# Patient Record
Sex: Female | Born: 1980 | Race: White | Hispanic: No | Marital: Married | Smoking: Never smoker
Health system: Southern US, Community
[De-identification: ages and names within clinical notes are randomized; demographics above are authoritative.]

## PROBLEM LIST (undated history)

## (undated) DIAGNOSIS — R42 Dizziness and giddiness: Secondary | ICD-10-CM

## (undated) DIAGNOSIS — E079 Disorder of thyroid, unspecified: Secondary | ICD-10-CM

## (undated) HISTORY — DX: Disorder of thyroid, unspecified: E07.9

## (undated) HISTORY — DX: Dizziness and giddiness: R42

---

## 2010-09-22 ENCOUNTER — Other Ambulatory Visit: Payer: Self-pay | Admitting: Family Medicine

## 2010-09-22 DIAGNOSIS — IMO0002 Reserved for concepts with insufficient information to code with codable children: Secondary | ICD-10-CM

## 2010-09-24 ENCOUNTER — Ambulatory Visit
Admission: RE | Admit: 2010-09-24 | Discharge: 2010-09-24 | Disposition: A | Discharge status: Home / Self Care | Payer: PRIVATE HEALTH INSURANCE | Source: Ambulatory Visit | Attending: Family Medicine | Admitting: Family Medicine

## 2010-09-29 ENCOUNTER — Other Ambulatory Visit (HOSPITAL_COMMUNITY): Payer: Self-pay | Admitting: Family Medicine

## 2010-09-29 DIAGNOSIS — E079 Disorder of thyroid, unspecified: Secondary | ICD-10-CM

## 2010-09-30 ENCOUNTER — Other Ambulatory Visit: Payer: Self-pay | Admitting: *Deleted

## 2010-09-30 ENCOUNTER — Other Ambulatory Visit (HOSPITAL_COMMUNITY): Payer: PRIVATE HEALTH INSURANCE

## 2010-09-30 ENCOUNTER — Other Ambulatory Visit: Payer: Self-pay | Admitting: Family Medicine

## 2010-09-30 DIAGNOSIS — E041 Nontoxic single thyroid nodule: Secondary | ICD-10-CM

## 2010-10-02 ENCOUNTER — Other Ambulatory Visit (HOSPITAL_COMMUNITY)
Admission: RE | Admit: 2010-10-02 | Discharge: 2010-10-02 | Disposition: A | Discharge status: Home / Self Care | Payer: PRIVATE HEALTH INSURANCE | Source: Ambulatory Visit | Attending: Interventional Radiology | Admitting: Interventional Radiology

## 2010-10-02 ENCOUNTER — Other Ambulatory Visit: Payer: Self-pay | Admitting: Interventional Radiology

## 2010-10-02 ENCOUNTER — Ambulatory Visit
Admission: RE | Admit: 2010-10-02 | Discharge: 2010-10-02 | Disposition: A | Discharge status: Home / Self Care | Payer: PRIVATE HEALTH INSURANCE | Source: Ambulatory Visit | Attending: Family Medicine | Admitting: Family Medicine

## 2010-10-02 DIAGNOSIS — E041 Nontoxic single thyroid nodule: Secondary | ICD-10-CM

## 2010-10-02 DIAGNOSIS — E049 Nontoxic goiter, unspecified: Secondary | ICD-10-CM | POA: Insufficient documentation

## 2010-10-20 ENCOUNTER — Encounter (INDEPENDENT_AMBULATORY_CARE_PROVIDER_SITE_OTHER): Payer: Self-pay | Admitting: General Surgery

## 2010-10-21 ENCOUNTER — Encounter (INDEPENDENT_AMBULATORY_CARE_PROVIDER_SITE_OTHER): Payer: Self-pay

## 2010-12-02 HISTORY — PX: THYROID LOBECTOMY: SHX420

## 2010-12-07 ENCOUNTER — Other Ambulatory Visit (INDEPENDENT_AMBULATORY_CARE_PROVIDER_SITE_OTHER): Payer: Self-pay | Admitting: General Surgery

## 2010-12-07 ENCOUNTER — Encounter (HOSPITAL_COMMUNITY): Payer: PRIVATE HEALTH INSURANCE

## 2010-12-07 LAB — CBC
HCT: 39.6 % (ref 36.0–46.0)
MCH: 27.6 pg (ref 26.0–34.0)
MCHC: 33.3 g/dL (ref 30.0–36.0)
MCV: 82.8 fL (ref 78.0–100.0)
Platelets: 176 10*3/uL (ref 150–400)
RDW: 13.3 % (ref 11.5–15.5)
WBC: 8.8 10*3/uL (ref 4.0–10.5)

## 2010-12-07 LAB — DIFFERENTIAL
Eosinophils Absolute: 0.1 10*3/uL (ref 0.0–0.7)
Eosinophils Relative: 1 % (ref 0–5)
Lymphocytes Relative: 16 % (ref 12–46)
Lymphs Abs: 1.4 10*3/uL (ref 0.7–4.0)
Monocytes Absolute: 0.5 10*3/uL (ref 0.1–1.0)
Monocytes Relative: 6 % (ref 3–12)

## 2010-12-07 LAB — URINALYSIS, ROUTINE W REFLEX MICROSCOPIC
Bilirubin Urine: NEGATIVE
Hgb urine dipstick: NEGATIVE
Ketones, ur: NEGATIVE mg/dL
Nitrite: NEGATIVE
Urobilinogen, UA: 0.2 mg/dL (ref 0.0–1.0)

## 2010-12-07 LAB — HCG, SERUM, QUALITATIVE: Preg, Serum: NEGATIVE

## 2010-12-07 LAB — COMPREHENSIVE METABOLIC PANEL
ALT: 9 U/L (ref 0–35)
AST: 12 U/L (ref 0–37)
Albumin: 3.8 g/dL (ref 3.5–5.2)
CO2: 27 mEq/L (ref 19–32)
Calcium: 9.6 mg/dL (ref 8.4–10.5)
Creatinine, Ser: 0.61 mg/dL (ref 0.50–1.10)
GFR calc non Af Amer: >60 mL/min (ref >60)
Sodium: 140 mEq/L (ref 135–145)
Total Protein: 7.9 g/dL (ref 6.0–8.3)

## 2010-12-07 LAB — SURGICAL PCR SCREEN: Staphylococcus aureus: NEGATIVE

## 2010-12-10 ENCOUNTER — Ambulatory Visit (HOSPITAL_COMMUNITY)
Admission: RE | Admit: 2010-12-10 | Discharge: 2010-12-10 | Disposition: A | Discharge status: Home / Self Care | Payer: PRIVATE HEALTH INSURANCE | Source: Ambulatory Visit | Attending: General Surgery | Admitting: General Surgery

## 2010-12-10 ENCOUNTER — Other Ambulatory Visit (INDEPENDENT_AMBULATORY_CARE_PROVIDER_SITE_OTHER): Payer: Self-pay | Admitting: General Surgery

## 2010-12-10 DIAGNOSIS — Z01810 Encounter for preprocedural cardiovascular examination: Secondary | ICD-10-CM | POA: Insufficient documentation

## 2010-12-10 DIAGNOSIS — Z01818 Encounter for other preprocedural examination: Secondary | ICD-10-CM

## 2010-12-10 DIAGNOSIS — E079 Disorder of thyroid, unspecified: Secondary | ICD-10-CM | POA: Insufficient documentation

## 2010-12-10 DIAGNOSIS — Z01812 Encounter for preprocedural laboratory examination: Secondary | ICD-10-CM | POA: Insufficient documentation

## 2010-12-18 ENCOUNTER — Ambulatory Visit (HOSPITAL_COMMUNITY)
Admission: RE | Admit: 2010-12-18 | Discharge: 2010-12-19 | Disposition: A | Discharge status: Home / Self Care | Payer: PRIVATE HEALTH INSURANCE | Source: Ambulatory Visit | Attending: General Surgery | Admitting: General Surgery

## 2010-12-18 ENCOUNTER — Other Ambulatory Visit (INDEPENDENT_AMBULATORY_CARE_PROVIDER_SITE_OTHER): Payer: Self-pay | Admitting: General Surgery

## 2010-12-18 DIAGNOSIS — E041 Nontoxic single thyroid nodule: Secondary | ICD-10-CM | POA: Insufficient documentation

## 2010-12-18 DIAGNOSIS — Z01818 Encounter for other preprocedural examination: Secondary | ICD-10-CM | POA: Insufficient documentation

## 2010-12-18 DIAGNOSIS — Z01812 Encounter for preprocedural laboratory examination: Secondary | ICD-10-CM | POA: Insufficient documentation

## 2010-12-18 DIAGNOSIS — Z01810 Encounter for preprocedural cardiovascular examination: Secondary | ICD-10-CM | POA: Insufficient documentation

## 2010-12-18 HISTORY — PX: THYROID SURGERY: SHX805

## 2010-12-18 LAB — NO BLOOD PRODUCTS

## 2010-12-21 NOTE — Op Note (Signed)
NAME:  Lindsey Foley, Lindsey Foley                   ACCOUNT NO.:  [id number]  MEDICAL RECORD NO.:  [id number]  LOCATION:  1539                         FACILITY:  [facility]  PHYSICIAN:  Angelia Mould. Derrell Lolling, M.D.DATE OF BIRTH:  1980/11/07  DATE OF PROCEDURE:  12/18/2010 DATE OF DISCHARGE:                              OPERATIVE REPORT   PREOPERATIVE DIAGNOSIS:  Left thyroid nodule with atypical cytology.  POSTOPERATIVE DIAGNOSIS:  Left thyroid nodule with atypical cytology.  OPERATION PERFORMED:  Left thyroid lobectomy with frozen section.  SURGEON:  Angelia Mould. Derrell Lolling, MD  FIRST ASSISTANT:  Lorne Skeens. Hoxworth, MD  OPERATIVE INDICATIONS:  This is a 30 year old Caucasian female who was discovered to have a left thyroid mass when she felt a small lump in her left neck back in May of this year.  She could feel this, but there was no voice change or swallowing problems.  Thyroid function tests were normal.  A thyroid ultrasound showed a 2.3 x 1.7 x 2.2 cm complex cystic and solid lesion in the inferior aspect of the left lobe.  There were also 3 tiny subcentimeter nodules in the right thyroid lobe.  There was no adenopathy.  Fine-needle aspiration cytology was performed at the left inferior pole nodule.  Cytopathology showed follicular cells with some mild cytologic atypia.  She was counseled as an outpatient.  Her risk tolerance is very low and she wanted to have this area excised because of the mild atypia.  I felt that was an appropriate option and she is brought to operating room electively.  OPERATIVE FINDINGS:  There was a small, firm nodule in the left inferior pole.  It was about 1 cm in size, smaller than the imaging dimensions on ultrasound.  There was no adenopathy.  I could palpate no abnormality of the right thyroid lobe.  Frozen section diagnosis revealed some atypia versus biopsy artifact.  Dr. Frederica Kuster and Dr. Luisa Hart looked at this and were unable to make a diagnosis of cancer and we felt  that there was no indication for completion thyroidectomy.  Further studies will be done.  OPERATIVE TECHNIQUE:  Following induction of general endotracheal anesthesia, the patient was position with her arms at her sides.  They were rolled behind her shoulders and her neck extended.  The neck was prepped and draped in sterile fashion.  Intravenous antibiotics were given.  The patient was identified as correct patient and correct procedure.  A short transverse collar incision was made in the anterior neck about 2 cm above the suprasternal notch.  Dissection was carried down through the platysma muscles.  Skin and platysma flaps were raised superiorly and inferiorly.  A  retractor was placed.  The strap muscles were divided in the midline.  I dissected the strap muscles off the left thyroid lobe.  Some inferior pole vessels were isolated, controlled with tiny metal clips, and divided with Harmonic scalpel. The superior pole took some time to dissect as it went very high.  We identified and preserved the left superior parathyroid gland.  Weultimately took down the superior pole vessels and placed a metal clip on the most superior aspect and then used the Harmonic scalpel  to divide the more inferior part.  We dissected a few other small vascular attachments and then dissected the superior pole down.  We then dissected the rest of the left lobe from lateral to medial.  We kept the dissection in the capsule of the thyroid to avoid injury to the inferior parathyroid gland and the recurrent laryngeal nerve.  We felt that we saw the left recurrent laryngeal nerve and preserved that.  The left thyroid lobe easily came up off the trachea and we took the dissection across the trachea.  We divided the right side at the isthmus with the Harmonic scalpel.  We marked the specimen with silk suture.  The specimen was sent to the lab.  Dr. Frederica Kuster spent about 40 minutes studying the entire nodule.  He felt  that there was some atypia, but could not decide whether this was a low-grade papillary carcinoma or artifact from the biopsy.  We chose not to do any further surgery.  The wound was irrigated with saline.  Hemostasis was excellent.  Some fibrillar hemostatic sponge was placed.  The wound was observed for about 5 minutes, there was no bleeding.  The strap muscles were closed in the midline with interrupted sutures of 3-0 Vicryl.  The platysma muscles were closed with interrupted sutures of 3-0 Vicryl and skin closed with running subcuticular suture of 4-0 Monocryl and Dermabond.  The patient tolerated the procedure well and was taken to recovery room in stable condition.  Estimated blood loss was about 15 cc.  Complications none. Sponge, needle, and instrument counts were correct.     Angelia Mould. Derrell Lolling, M.D.     HMI/MEDQ  D:  12/18/2010  T:  12/19/2010  Job:  [id number]  cc:   Marjory Lies, M.D. Fax: [phone]  Electronically Signed by Claud Kelp M.D. on 12/21/2010 07:38:33 AM

## 2010-12-22 ENCOUNTER — Telehealth (INDEPENDENT_AMBULATORY_CARE_PROVIDER_SITE_OTHER): Payer: Self-pay

## 2010-12-22 NOTE — Telephone Encounter (Signed)
Pt called and wondered if she needed to take a calcium supplement or get follow labs.   I spoke to [facility] Dr Jacinto Halim nurse.  Due to the type of surgery she had she does not need to take any meds.  I told her the symptoms of low calcium and told her to call us if she has those.  She has a f/u on 9-5

## 2010-12-22 NOTE — Telephone Encounter (Signed)
LMOM to call for path result.

## 2010-12-22 NOTE — Telephone Encounter (Signed)
Pt returned call for path. Pt advised path = no sign of cancer. Pt to call if has any numbness or muscle cramps or other concerns.

## 2011-01-05 ENCOUNTER — Institutional Professional Consult (permissible substitution): Payer: PRIVATE HEALTH INSURANCE | Admitting: Cardiology

## 2011-01-06 ENCOUNTER — Ambulatory Visit (INDEPENDENT_AMBULATORY_CARE_PROVIDER_SITE_OTHER): Payer: PRIVATE HEALTH INSURANCE | Admitting: General Surgery

## 2011-01-06 ENCOUNTER — Encounter (INDEPENDENT_AMBULATORY_CARE_PROVIDER_SITE_OTHER): Payer: Self-pay | Admitting: General Surgery

## 2011-01-06 VITALS — BP 116/68 | HR 78 | Temp 96.6°F

## 2011-01-06 DIAGNOSIS — Z9889 Other specified postprocedural states: Secondary | ICD-10-CM

## 2011-01-06 NOTE — Progress Notes (Signed)
Subjective:     Patient ID: Lindsey Foley, female   DOB: Jun 01, 1980, 30 y.o.   MRN: [id number]  HPI This patient underwent left thyroid lobectomy with frozen section on December 18, 2010. Intraoperative frozen section raised a concern for malignancy but they could not confirm this on frozen section. Second opinion was obtained and the final pathology report shows no evidence of malignancy. Thyroid tissue shows cystic degenerative changes. 2 parathyroid glands were found in the specimen. This was surprising since we were almost certain we had preserved the left superior parathyroid gland had kept the dissection in the capsule of the thyroid.  She feels finee. She is swallowing well. She feels that her voice is normal. She denies any paresthesias. I have reviewed her pathology report with her, including the fact that parathyroid glands were removed on the left side. I told her that if she ever had to have the right thyroid lobe removed, that the surgeon would need to be very careful not to remove any other parathyroid glands.  Review of Systems     Objective:   Physical Exam The patient looks very well. She is in good spirits.  Voice is strong. She phonates well.  Chvostek's is sign is positive bilaterally.  Neck incision is healing very nicely. No swelling or complications.    Assessment:     Left thyroid nodule, final pathology report revealed benign cystic degenerative changes.  Status post left thyroid lobectomy. Parathyroid tissue was present within the surgical specimen.  She is asymptomatic and recovering uneventfully.    Plan:     Okay to resume all normal physical activities without restriction.  Obtain lab work to include calcium, TSH, intact parathyroid hormone level today or tomorrow.  Return to see me in 6 weeks for a followup check.

## 2011-01-06 NOTE — Patient Instructions (Signed)
You are doing very well following your left thyroid lobectomy. Your voice is normal. The final pathology report shows no evidence of malignancy, only cystic degenerative changes and biopsy changes. We did find parathyroid glands in the specimen. You may resume all normal physical activities without restriction. You will be sent for lab work today. I will see you back in the office in 6 weeks.

## 2011-01-12 ENCOUNTER — Telehealth (INDEPENDENT_AMBULATORY_CARE_PROVIDER_SITE_OTHER): Payer: Self-pay

## 2011-01-12 NOTE — Telephone Encounter (Signed)
I tried to reach pt re: labs [tsh,calcium,intact pth] she was to have on 01-06-11. [facility] has no info in system showing pt had any labs drawn with them. I lmom for pt to call with if and where labs done.

## 2011-02-02 ENCOUNTER — Other Ambulatory Visit (INDEPENDENT_AMBULATORY_CARE_PROVIDER_SITE_OTHER): Payer: Self-pay | Admitting: General Surgery

## 2011-02-02 DIAGNOSIS — Z9889 Other specified postprocedural states: Secondary | ICD-10-CM

## 2011-02-03 ENCOUNTER — Other Ambulatory Visit (INDEPENDENT_AMBULATORY_CARE_PROVIDER_SITE_OTHER): Payer: Self-pay | Admitting: General Surgery

## 2011-02-04 LAB — TSH: TSH: 3.406 u[IU]/mL (ref 0.350–4.500)

## 2011-02-18 ENCOUNTER — Telehealth (INDEPENDENT_AMBULATORY_CARE_PROVIDER_SITE_OTHER): Payer: Self-pay

## 2011-02-18 NOTE — Telephone Encounter (Signed)
I lmom for pt [id number]. Pt has appt 10-22. Dr Derrell Lolling off.  Dr Derrell Lolling will discuss labs at this office visit.

## 2011-02-22 ENCOUNTER — Ambulatory Visit (INDEPENDENT_AMBULATORY_CARE_PROVIDER_SITE_OTHER): Payer: PRIVATE HEALTH INSURANCE | Admitting: General Surgery

## 2011-02-22 ENCOUNTER — Encounter (INDEPENDENT_AMBULATORY_CARE_PROVIDER_SITE_OTHER): Payer: Self-pay | Admitting: General Surgery

## 2011-02-22 VITALS — BP 112/78 | HR 76 | Temp 97.9°F | Resp 12 | Ht 65.0 in | Wt 137.0 lb

## 2011-02-22 DIAGNOSIS — Z9889 Other specified postprocedural states: Secondary | ICD-10-CM

## 2011-02-22 NOTE — Progress Notes (Signed)
Subjective:     Patient ID: Lindsey Foley, female   DOB: 1980/12/01, 30 y.o.   MRN: [id number]  HPI  This patient underwent left thyroid lobectomy several weeks ago. She has done well. She has no complaints. Voice is normal. Swallowing is normal. No pain or pressure symptoms. Scar healing uneventfully.  Pathology showed benign degenerative changes in the left thyroid lobe and 2 parathyroid glands were found embedded within the thyroid.  Labs recently shows TSH 3.406, calcium level 9.7, parathyroid hormone 17.2. These are all normal. Review of Systems     Objective:   Physical Exam Patient is in no distress. She has her 66-year-old with her.  Voice normal  Chvostek's sign negative bilaterally.  Neck supple nontender. Scar well healed. No mass    Assessment:     Benign, nontoxic multinodular goiter, uneventful recovery following left thyroid lobectomy.  Benign appearing, subcentimeter nodules remain in the right thyroid lobe. These are low risk.    Plan:     I advised her to get a thyroid ultrasound in 2 years, and review that with Dr. Marjory Lies.  Return to see me p.r.n.

## 2011-02-22 NOTE — Patient Instructions (Signed)
Your  physical exam is normal today. All of your lab work is normal. I do not think that anything further needs to be done. You have tiny nodules in the remaining right thyroid lobe. I recommended that you get an ultrasound in 2 years and have Dr. Rosezetta Schlatter review that with you. Return to see me if any new problems arise.

## 2011-10-01 ENCOUNTER — Ambulatory Visit (INDEPENDENT_AMBULATORY_CARE_PROVIDER_SITE_OTHER): Payer: BC Managed Care – PPO | Admitting: Internal Medicine

## 2011-10-01 ENCOUNTER — Ambulatory Visit (INDEPENDENT_AMBULATORY_CARE_PROVIDER_SITE_OTHER)
Admission: RE | Admit: 2011-10-01 | Discharge: 2011-10-01 | Disposition: A | Discharge status: Home / Self Care | Payer: BC Managed Care – PPO | Source: Ambulatory Visit | Attending: Internal Medicine | Admitting: Internal Medicine

## 2011-10-01 ENCOUNTER — Encounter: Payer: Self-pay | Admitting: Internal Medicine

## 2011-10-01 VITALS — BP 110/80 | HR 100 | Ht 65.0 in | Wt 136.0 lb

## 2011-10-01 DIAGNOSIS — R0602 Shortness of breath: Secondary | ICD-10-CM

## 2011-10-01 DIAGNOSIS — R0609 Other forms of dyspnea: Secondary | ICD-10-CM

## 2011-10-01 DIAGNOSIS — H7290 Unspecified perforation of tympanic membrane, unspecified ear: Secondary | ICD-10-CM

## 2011-10-01 DIAGNOSIS — R0600 Dyspnea, unspecified: Secondary | ICD-10-CM

## 2011-10-01 DIAGNOSIS — R0989 Other specified symptoms and signs involving the circulatory and respiratory systems: Secondary | ICD-10-CM

## 2011-10-01 DIAGNOSIS — J310 Chronic rhinitis: Secondary | ICD-10-CM

## 2011-10-01 DIAGNOSIS — H7291 Unspecified perforation of tympanic membrane, right ear: Secondary | ICD-10-CM

## 2011-10-01 DIAGNOSIS — IMO0001 Reserved for inherently not codable concepts without codable children: Secondary | ICD-10-CM

## 2011-10-01 NOTE — Patient Instructions (Signed)
Order- CXR- dx dyspnea              CT face without contrast    Dx dyspnea, hx traumatic perforation right tympanum

## 2011-10-01 NOTE — Progress Notes (Signed)
10/01/11- 30 yoF smoker referred courtesy of Dr. Marjory Lies /[facility] because of shortness of breath. She says this symptom has been newly the past year. She relates onset to an episode where she was bumped by her small daughter and perforated her eardrum with a Q-tip. One week later she had dizziness, vertigo, shortness of breath. Vertigo is described as mild intermittent unsteadiness. A neurologist tried the Epley maneuver unsuccessfully. An ENT did calorics which were inconclusive. She was found to have a thyroid nodule which was removed in August of 2012. The mild non-exertional dyspnea persisted. It was waking her to sit up at night. Deep breaths are unsatisfying. Sometimes she feels something in her upper trachea but other times is not. Blowing some mucus from her nose and complains of nasal congestion but the ENT did not examine her nose. She has no prior lung history. She says she was fine while backpacking at altitude in April during some hard hiking without shortness of breath. She wears a mask when working outdoors. Minor dry cough but no wheeze. Occasional headache but little sneezing or blowing usually. Denies history of previous allergy concern. No unusual exposures. 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  Prior to Admission medications   Medication Sig Start Date End Date Taking? Authorizing Provider  CALCIUM PO Take by mouth. Once a week   Yes Historical Provider, MD  Cholecalciferol (VITAMIN D PO) Take by mouth. Once a week   Yes Historical Provider, MD   Past Medical History  Diagnosis Date  . Vertigo   . Thyroid disease    Past Surgical History  Procedure Date  . Thyroid surgery 12/18/2010  . Thyroid lobectomy 12/02/10  .famh History   Social History  . Marital Status: Married    Spouse Name: N/A    Number of Children: N/A  . Years of Education: N/A   Occupational History  . Not on file.   Social History Main Topics  . Smoking status:  Never Smoker   . Smokeless tobacco: Not on file  . Alcohol Use: Yes     once or twice per week  . Drug Use: No  . Sexually Active: Not on file   Other Topics Concern  . Not on file   Social History Narrative  . No narrative on file  .ROS-see HPI Constitutional:   No-   weight loss, night sweats, fevers, chills, fatigue, lassitude. HEENT:   No-  headaches, difficulty swallowing, tooth/dental problems, sore throat,       No-  sneezing, itching, ear ache, +nasal congestion, post nasal drip,  CV:  No-   chest pain, orthopnea, PND, swelling in lower extremities, anasarca, dizziness, palpitations Resp: +  shortness of breath with exertion or at rest.              No-   productive cough,  + non-productive cough,  No- coughing up of blood.              No-   change in color of mucus.  No- wheezing.   Skin: No-   rash or lesions. GI:  No-   heartburn, indigestion, abdominal pain, nausea, vomiting, diarrhea,                 change in bowel habits, loss of appetite GU: No-   dysuria, change in color of urine, no urgency or frequency.  No- flank pain. MS:  No-   joint pain or swelling.  No-  decreased range of motion.  No- back pain. Neuro-     nothing unusual Psych:  No- change in mood or affect. No depression or anxiety.  No memory loss.  OBJ- Physical Exam General- Alert, Oriented, Affect-appropriate, Distress- none acute, WDWN Skin- rash-none, lesions- none, excoriation- none Lymphadenopathy- none Head- atraumatic            Eyes- Gross vision intact, PERRLA, conjunctivae and secretions clear            Ears- Hearing, canals-normal            Nose- mild turbinate edema, no-Septal dev, mucus, polyps, erosion, perforation             Throat- Mallampati II , mucosa clear , drainage- none, tonsils- atrophic Neck- flexible , trachea midline, no stridor , thyroid nl, carotid no bruit Chest - symmetrical excursion , unlabored           Heart/CV- RRR , no murmur , no gallop  , no rub, nl s1  s2                           - JVD- none , edema- none, stasis changes- none, varices- none           Lung- clear to P&A, wheeze- none, cough- none , dullness-none, rub- none           Chest wall-  Abd- tender-no, distended-no, bowel sounds-present, HSM- no Br/ Gen/ Rectal- Not done, not indicated Extrem- cyanosis- none, clubbing, none, atrophy- none, strength- nl Neuro- grossly intact to observation

## 2011-10-05 NOTE — Progress Notes (Signed)
Quick Note:  LMTCB ______ 

## 2011-10-06 ENCOUNTER — Encounter: Payer: Self-pay | Admitting: Internal Medicine

## 2011-10-06 DIAGNOSIS — IMO0001 Reserved for inherently not codable concepts without codable children: Secondary | ICD-10-CM | POA: Insufficient documentation

## 2011-10-06 DIAGNOSIS — J310 Chronic rhinitis: Secondary | ICD-10-CM | POA: Insufficient documentation

## 2011-10-06 NOTE — Assessment & Plan Note (Signed)
She describes occasionally bothersome nasal congestion. This may be allergic. It may be contributing to her otherwise unexplained complaint of dyspnea unrelated to exertion. I don't know if there is a way to tie in her history of previous TM injury with Q-tip. If the connection is real, and some mechanism involving her eustachian tube seems likely. Plan-based on discussion with radiologist we are ordering CT scan of Face, which should go back far enough to include the middle ear.

## 2011-10-06 NOTE — Assessment & Plan Note (Signed)
Nonspecific dyspnea noted primarily when she is at rest and attentive, but not with significant exertion, hiking at altitude.  sometimes increased nasal airway obstruction is felt as more generalized shortness of breath. Plan-chest x-ray

## 2011-10-07 NOTE — Progress Notes (Signed)
Quick Note:  Pt aware of results. ______ 

## 2011-10-14 ENCOUNTER — Ambulatory Visit (INDEPENDENT_AMBULATORY_CARE_PROVIDER_SITE_OTHER)
Admission: RE | Admit: 2011-10-14 | Discharge: 2011-10-14 | Disposition: A | Discharge status: Home / Self Care | Payer: BC Managed Care – PPO | Source: Ambulatory Visit | Attending: Internal Medicine | Admitting: Internal Medicine

## 2011-10-14 ENCOUNTER — Telehealth: Payer: Self-pay | Admitting: Internal Medicine

## 2011-10-14 DIAGNOSIS — H7290 Unspecified perforation of tympanic membrane, unspecified ear: Secondary | ICD-10-CM

## 2011-10-14 DIAGNOSIS — H7291 Unspecified perforation of tympanic membrane, right ear: Secondary | ICD-10-CM

## 2011-10-14 DIAGNOSIS — R0600 Dyspnea, unspecified: Secondary | ICD-10-CM

## 2011-10-14 DIAGNOSIS — R0989 Other specified symptoms and signs involving the circulatory and respiratory systems: Secondary | ICD-10-CM

## 2011-10-14 NOTE — Telephone Encounter (Signed)
CY pt is calling and requesting the results of the ct scan that was done this morning. Please advise.  thanks

## 2011-10-14 NOTE — Telephone Encounter (Signed)
Pt aware that we have not gotten the results back as of this time. As soon as results are released to [facility] then I will call her with results. Pt was very understanding and will forward to CY as a reminder.

## 2011-10-15 ENCOUNTER — Telehealth: Payer: Self-pay | Admitting: Internal Medicine

## 2011-10-15 NOTE — Telephone Encounter (Signed)
Pt is requesting her sinus CT results. Please advise Dr. Maple Hudson thanks

## 2011-10-18 NOTE — Telephone Encounter (Signed)
Pt returned call.  Advised of CT results / recs as stated by CDY.  Pt verbalized her understanding but does ask if she needs to keep her 7.5.13 follow up with CDY.  Advised pt that we would like to see how she is doing but pt requests to know from CDY.  Dr Maple Hudson please advise, thanks.  *ok to LM if unable to reach pt.

## 2011-10-18 NOTE — Telephone Encounter (Signed)
Spoke with pt and notified of recs per CDY. Pt verbalized understanding and states prefers to just keep her planned appt. Nothing further needed.

## 2011-10-18 NOTE — Telephone Encounter (Signed)
Pt called back Lindsey Foley  

## 2011-10-18 NOTE — Telephone Encounter (Signed)
Notes Recorded by Waymon Budge, MD on 10/15/2011 at 4:45 PM CT shows clear sinuses and ears/ canals look normal by this imaging technique. Nothing seen to suggest further studies for now.  lmomtcb x1

## 2011-10-18 NOTE — Telephone Encounter (Signed)
Error.  Duplicate message.  Holly D Pryor °- °

## 2011-10-18 NOTE — Telephone Encounter (Signed)
I have "result noted" this CT of the face report and forwarded to you to  Contact patient. Thanks.

## 2011-10-18 NOTE — Telephone Encounter (Signed)
Boone Master, MA 10/18/2011 12:30 PM Signed  Pt returned call. Advised of CT results / recs as stated by CDY. Pt verbalized her understanding but does ask if she needs to keep her 7.5.13 follow up with CDY. Advised pt that we would like to see how she is doing but pt requests to know from CDY. Dr Maple Hudson please advise, thanks.  *ok to LM if unable to reach pt.  Please see phone note on 10-15-11 for more information.

## 2011-10-18 NOTE — Telephone Encounter (Signed)
Her initial concern was shortness of breath. CXR was normal. She worried that there might be a connection to an injury to her ear, but CT scan and ENT exam were normal. If she is still having concerning shortness of breath, then I should see her again and get PFTs.  If shortness of breath is no longer bothering her, then I will be happy to see her again if needed.

## 2011-10-18 NOTE — Telephone Encounter (Signed)
Pt is requesting results.  Pt is upset that she was not called on Friday.  Pt asked to be reached at [phone].  Lindsey Foley

## 2011-10-21 NOTE — Progress Notes (Signed)
Quick Note:  Lindsey Foley, [facility] 10/18/2011 2:41 PM Signed Spoke with pt and notified of recs per CDY. Pt verbalized understanding and states prefers to just keep her planned appt. Nothing further needed.  Waymon Budge, MD 10/18/2011 1:05 PM Signed Her initial concern was shortness of breath. CXR was normal. She worried that there might be a connection to an injury to her ear, but CT scan and ENT exam were normal. If she is still having concerning shortness of breath, then I should see her again and get PFTs. If shortness of breath is no longer bothering her, then I will be happy to see her again if needed ______

## 2011-11-05 ENCOUNTER — Encounter: Payer: Self-pay | Admitting: Internal Medicine

## 2011-11-05 ENCOUNTER — Ambulatory Visit (INDEPENDENT_AMBULATORY_CARE_PROVIDER_SITE_OTHER): Payer: BC Managed Care – PPO | Admitting: Internal Medicine

## 2011-11-05 VITALS — BP 112/82 | HR 85 | Ht 65.0 in | Wt 137.0 lb

## 2011-11-05 DIAGNOSIS — R0602 Shortness of breath: Secondary | ICD-10-CM

## 2011-11-05 DIAGNOSIS — IMO0001 Reserved for inherently not codable concepts without codable children: Secondary | ICD-10-CM

## 2011-11-05 NOTE — Patient Instructions (Addendum)
Order  PFT and 6 MWT    Dx dyspnea  You may want to have Dr Doristine Counter talk with a couple of ENT/ Neurology doctors before referral, to be sure of the most useful path.

## 2011-11-05 NOTE — Progress Notes (Signed)
10/01/11- 30 yoF never smoker referred courtesy of Dr. Marjory Lies /[facility] because of shortness of breath. She says this symptom has been newly the past year. She relates onset to an episode where she was bumped by her small daughter and perforated her eardrum with a Q-tip. One week later she had dizziness, vertigo, shortness of breath. Vertigo is described as mild intermittent unsteadiness. A neurologist tried the Epley maneuver unsuccessfully. An ENT did calorics which were inconclusive. She was found to have a thyroid nodule which was removed in August of 2012. The mild non-exertional dyspnea persisted. It was waking her to sit up at night. Deep breaths are unsatisfying. Sometimes she feels something in her upper trachea but other times is not. Blowing some mucus from her nose and complains of nasal congestion but the ENT did not examine her nose. She has no prior lung history. She says she was fine while backpacking at altitude in April during some hard hiking without shortness of breath. She wears a mask when working outdoors. Minor dry cough but no wheeze. Occasional headache but little sneezing or blowing usually. Denies history of previous allergy concern. No unusual exposures. 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  11/05/11- 30 yoF never smoker followed for dyspnea, rhinitis. No improvement since last visit-has not noticed any change. She denies any change in her sense of exertional dyspnea, unrelated to the rainy weather outside, position or exposures. She has a feeling of anterior chest wall tightness which is constant. She still thinks something about her years and onset of vertigo or related to the dyspnea, starting after she ruptured eardrum. She has been photophobic, often getting nauseated at the grocery store, and unsteady. We discussed possibility of vestibular migraine. Her cardiologist had told her this was not a cardiac issue. We reviewed her imaging  studies. CXR 10/07/11-  IMPRESSION:  Negative chest.  Original Report Authenticated By: Jamesetta Orleans. MATTERN, M.D.  CT max facial-  Findings: The eardrum is difficult to adequately assessed by CT  imaging but appears to be grossly intact without findings of  cholesteatoma. Ossicles intact. Middle ear cavity and mastoid air  cells clear.  The roof of the superior semicircular canal is thin bilaterally  without clear dehiscence. Visualized paranasal sinuses are clear.  Visualized intracranial structures unremarkable without obvious  internal auditory canal lesion. If this is of concern, MR imaging  utilizing internal auditory canal protocol contrast would be more  sensitive for detection of such.  Nasal septal spur to the right.  IMPRESSION:  No findings of cholesteatoma. Please see above.  Original Report Authenticated By: Fuller Canada, M.D.   ROS-see HPI Constitutional:   No-   weight loss, night sweats, fevers, chills, fatigue, lassitude. HEENT:   No-  headaches, difficulty swallowing, tooth/dental problems, sore throat,       No-  sneezing, itching, ear ache, +nasal congestion, post nasal drip,  CV:  No-   chest pain, orthopnea, PND, swelling in lower extremities, anasarca, dizziness, palpitations Resp: +  shortness of breath with exertion or at rest.              No-   productive cough,  + non-productive cough,  No- coughing up of blood.              No-   change in color of mucus.  No- wheezing.   Skin: No-   rash or lesions. GI:  No-   heartburn, indigestion, abdominal pain, nausea,  vomiting,  GU:  MS:  No-   joint pain or swelling.  . Neuro-     nothing unusual Psych:  No- change in mood or affect. No depression or anxiety.  No memory loss.  OBJ- Physical Exam General- Alert, Oriented, Affect-appropriate, Distress- none acute, WDWN Skin- rash-none, lesions- none, excoriation- none Lymphadenopathy- none Head- atraumatic            Eyes- Gross vision intact, PERRLA,  conjunctivae and secretions clear            Ears- Hearing, canals-normal            Nose- mild turbinate edema, no-Septal dev, mucus, polyps, erosion, perforation             Throat- Mallampati II , mucosa clear , drainage- none, tonsils- atrophic Neck- flexible , trachea midline, no stridor , thyroid nl, carotid no bruit Chest - symmetrical excursion , unlabored           Heart/CV- RRR , no murmur , no gallop  , no rub, nl s1 s2                           - JVD- none , edema- none, stasis changes- none, varices- none           Lung- clear to P&A, wheeze- none, cough- none , dullness-none, rub- none           Chest wall-  Abd- Br/ Gen/ Rectal- Not done, not indicated Extrem- cyanosis- none, clubbing, none, atrophy- none, strength- nl Neuro- grossly intact to observation

## 2011-11-08 NOTE — Assessment & Plan Note (Signed)
We're not able to identify any abnormality of structural anatomy corresponding to her complaints. Plan-6 minute walk test, PFT. If these are unrevealing, I will likely have to feel her symptoms are not in my area of experience.

## 2011-11-19 ENCOUNTER — Ambulatory Visit (INDEPENDENT_AMBULATORY_CARE_PROVIDER_SITE_OTHER): Payer: BC Managed Care – PPO | Admitting: Internal Medicine

## 2011-11-19 DIAGNOSIS — R0600 Dyspnea, unspecified: Secondary | ICD-10-CM

## 2011-11-19 DIAGNOSIS — IMO0001 Reserved for inherently not codable concepts without codable children: Secondary | ICD-10-CM

## 2011-11-19 DIAGNOSIS — R0602 Shortness of breath: Secondary | ICD-10-CM

## 2011-11-19 DIAGNOSIS — R0989 Other specified symptoms and signs involving the circulatory and respiratory systems: Secondary | ICD-10-CM

## 2011-11-19 LAB — PULMONARY FUNCTION TEST

## 2011-11-19 NOTE — Progress Notes (Signed)
PFT Done. Jennifer Castillo, CMA  

## 2011-11-23 NOTE — Progress Notes (Signed)
Patient ID: Lindsey Foley, female   DOB: [birth year], 31 y.o.   MRN: [id number] Document 6 minute walk test

## 2011-12-01 ENCOUNTER — Telehealth: Payer: Self-pay | Admitting: Internal Medicine

## 2011-12-01 NOTE — Telephone Encounter (Signed)
This must be pending scan still.

## 2011-12-01 NOTE — Telephone Encounter (Signed)
Pt had PFTs done on 11/19/11.  She hasn't heard anything regarding this and requesting these results.  I informed pt Dr. Maple Hudson is out of office today, apologized for the delay in this, and advised we would call her back tomorrow with a response from Dr. Maple Hudson.  Dr. Maple Hudson, pls advise on PFT results.  Thank you.

## 2011-12-02 NOTE — Telephone Encounter (Signed)
PFT- normal pulmonary function test results.

## 2011-12-02 NOTE — Telephone Encounter (Signed)
LMTCB

## 2011-12-02 NOTE — Telephone Encounter (Signed)
Katie, as this hasn't been scanned in yet, will you pls see if Tammy W can print copy so we can get pt results today?  Thank you.

## 2011-12-02 NOTE — Telephone Encounter (Signed)
PFT's have been scanned into [facility] already-I have printed the results for CY to advise. Message forwarded to [facility] as well.

## 2011-12-03 NOTE — Telephone Encounter (Signed)
Called spoke with patient, advised of PFT results as stated by CY.  Pt verbalized her understanding and will keep her 8.15.13 ov with CY.  Appt card mailed to pt's verified home address per pt's request.

## 2011-12-16 ENCOUNTER — Ambulatory Visit: Payer: BC Managed Care – PPO | Admitting: Internal Medicine

## 2012-05-18 ENCOUNTER — Ambulatory Visit (INDEPENDENT_AMBULATORY_CARE_PROVIDER_SITE_OTHER): Payer: BC Managed Care – PPO | Admitting: Otolaryngology

## 2012-05-18 DIAGNOSIS — H81399 Other peripheral vertigo, unspecified ear: Secondary | ICD-10-CM

## 2012-05-18 DIAGNOSIS — R070 Pain in throat: Secondary | ICD-10-CM

## 2012-05-18 DIAGNOSIS — R42 Dizziness and giddiness: Secondary | ICD-10-CM

## 2012-05-18 DIAGNOSIS — K219 Gastro-esophageal reflux disease without esophagitis: Secondary | ICD-10-CM

## 2012-06-08 ENCOUNTER — Ambulatory Visit (INDEPENDENT_AMBULATORY_CARE_PROVIDER_SITE_OTHER): Payer: BC Managed Care – PPO | Admitting: Otolaryngology

## 2012-10-17 ENCOUNTER — Emergency Department (HOSPITAL_COMMUNITY)
Admission: EM | Admit: 2012-10-17 | Discharge: 2012-10-17 | Disposition: A | Discharge status: Home / Self Care | Payer: BC Managed Care – PPO | Attending: Emergency Medicine | Admitting: Emergency Medicine

## 2012-10-17 ENCOUNTER — Emergency Department (HOSPITAL_COMMUNITY): Payer: BC Managed Care – PPO

## 2012-10-17 ENCOUNTER — Encounter (HOSPITAL_COMMUNITY): Payer: Self-pay | Admitting: Emergency Medicine

## 2012-10-17 ENCOUNTER — Other Ambulatory Visit: Payer: Self-pay

## 2012-10-17 DIAGNOSIS — R0789 Other chest pain: Secondary | ICD-10-CM | POA: Insufficient documentation

## 2012-10-17 DIAGNOSIS — E0789 Other specified disorders of thyroid: Secondary | ICD-10-CM | POA: Insufficient documentation

## 2012-10-17 DIAGNOSIS — Z8669 Personal history of other diseases of the nervous system and sense organs: Secondary | ICD-10-CM | POA: Insufficient documentation

## 2012-10-17 DIAGNOSIS — Z862 Personal history of diseases of the blood and blood-forming organs and certain disorders involving the immune mechanism: Secondary | ICD-10-CM | POA: Insufficient documentation

## 2012-10-17 DIAGNOSIS — Z8639 Personal history of other endocrine, nutritional and metabolic disease: Secondary | ICD-10-CM | POA: Insufficient documentation

## 2012-10-17 DIAGNOSIS — R0602 Shortness of breath: Secondary | ICD-10-CM | POA: Insufficient documentation

## 2012-10-17 LAB — CBC WITH DIFFERENTIAL/PLATELET
Eosinophils Relative: 1 % (ref 0–5)
HCT: 40.2 % (ref 36.0–46.0)
Hemoglobin: 14.3 g/dL (ref 12.0–15.0)
Lymphocytes Relative: 18 % (ref 12–46)
Lymphs Abs: 1.7 10*3/uL (ref 0.7–4.0)
MCV: 83.4 fL (ref 78.0–100.0)
Monocytes Relative: 7 % (ref 3–12)
Platelets: 194 10*3/uL (ref 150–400)
RBC: 4.82 MIL/uL (ref 3.87–5.11)
WBC: 9.2 10*3/uL (ref 4.0–10.5)

## 2012-10-17 LAB — COMPREHENSIVE METABOLIC PANEL
ALT: 7 U/L (ref 0–35)
Alkaline Phosphatase: 55 U/L (ref 39–117)
BUN: 14 mg/dL (ref 6–23)
CO2: 24 mEq/L (ref 19–32)
Calcium: 9.6 mg/dL (ref 8.4–10.5)
GFR calc Af Amer: >90 mL/min (ref >90)
GFR calc non Af Amer: >90 mL/min (ref >90)
Glucose, Bld: 101 mg/dL — ABNORMAL HIGH (ref 70–99)
Sodium: 139 mEq/L (ref 135–145)

## 2012-10-17 NOTE — ED Notes (Signed)
PT. REPORTS INTERMITTENT MID CHEST PAIN /TIGHTNESS FOR 2 YEARS WITH SOB , DENIES COUGH /NAUSEA OR DIAPHORESIS.

## 2012-10-17 NOTE — ED Provider Notes (Signed)
History     CSN: [id number]  Arrival date & time 10/17/12  2041   First MD Initiated Contact with Patient 10/17/12 2245      Chief Complaint  Patient presents with  . Chest Pain   HPI  History provided by the patient. Patient is a 32 year old female with history of thyroid disease with partial thyroidectomy who presents with complaints of episodes of shortness of breath and chest discomfort. Patient states that she has been dealing with symptoms of shortness of breath and chest discomforts off and on for the past 2 years. She states that her symptoms first began after she perforated eardrum. Initially she had significant symptoms with vertigo and other issues. Most of her symptoms resolved except for recurrent episodes of shortness of breath and chest discomforts. These symptoms will occur at anytime of the day and during any type of activity or rest. They described as not feeling like she can get a full deep breath of air with some chest tightness and occasional sharp shooting pains throughout her chest. Symptoms are generally brief lasting only a few minutes. Patient has been evaluated multiple times by many specialists for these symptoms without any specific or concerning cause. Over the last 2 nights she reports having especially worse symptoms have been waking her up at night causing her to gasp for air. This is also cause poor sleep and made her feel fatigued throughout the day. Currently she denies any shortness of breath or pain at the moment. She has not used any medications for this. She denies any wheezing, cough, hemoptysis, heart palpitations, pain or swelling in extremities. Denies any recent long travel. No prior history of DVT or PE.    Past Medical History  Diagnosis Date  . Vertigo   . Thyroid disease     Past Surgical History  Procedure Laterality Date  . Thyroid surgery  12/18/2010  . Thyroid lobectomy  12/02/10    Family History  Problem Relation Age of Onset  .  Rheum arthritis Maternal Grandmother     History  Substance Use Topics  . Smoking status: Never Smoker   . Smokeless tobacco: Not on file  . Alcohol Use: Yes     Comment: once or twice per week    OB History   Grav Para Term Preterm Abortions TAB SAB Ect Mult Living                  Review of Systems  Constitutional: Negative for fever and diaphoresis.  Eyes: Negative for visual disturbance.  Respiratory: Positive for shortness of breath.   Cardiovascular: Positive for chest pain. Negative for palpitations and leg swelling.  Gastrointestinal: Negative for nausea, vomiting, abdominal pain, diarrhea and constipation.  Neurological: Negative for dizziness, weakness, light-headedness, numbness and headaches.  All other systems reviewed and are negative.    Allergies  Codeine  Home Medications   Current Outpatient Rx  Name  Route  Sig  Dispense  Refill  . etonogestrel-ethinyl estradiol (NUVARING) 0.12-0.015 MG/24HR vaginal ring   Vaginal   Place 1 each vaginally every 28 (twenty-eight) days. Insert vaginally and leave in place for 3 consecutive weeks, then remove for 1 week.           BP 117/79  Pulse 90  Temp(Src) 99.3 F (37.4 C) (Oral)  Resp 13  SpO2 100%  Physical Exam  Nursing note and vitals reviewed. Constitutional: She is oriented to person, place, and time. She appears well-developed and well-nourished. No distress.  HENT:  Head: Normocephalic.  Cardiovascular: Normal rate and regular rhythm.   No murmur heard. Pulmonary/Chest: Effort normal and breath sounds normal. No respiratory distress. She has no wheezes. She has no rales.  Abdominal: Soft.  Musculoskeletal: Normal range of motion. She exhibits no edema and no tenderness.  No clinical signs concerning for DVT  Neurological: She is alert and oriented to person, place, and time.  Skin: Skin is warm and dry. No rash noted.  Psychiatric: She has a normal mood and affect. Her behavior is normal.     ED Course  Procedures   Results for orders placed during the hospital encounter of 10/17/12  CBC WITH DIFFERENTIAL      Result Value Range   WBC 9.2  4.0 - 10.5 K/uL   RBC 4.82  3.87 - 5.11 MIL/uL   Hemoglobin 14.3  12.0 - 15.0 g/dL   HCT 16.1  09.6 - 04.5 %   MCV 83.4  78.0 - 100.0 fL   MCH 29.7  26.0 - 34.0 pg   MCHC 35.6  30.0 - 36.0 g/dL   RDW 40.9  81.1 - 91.4 %   Platelets 194  150 - 400 K/uL   Neutrophils Relative % 74  43 - 77 %   Neutro Abs 6.8  1.7 - 7.7 K/uL   Lymphocytes Relative 18  12 - 46 %   Lymphs Abs 1.7  0.7 - 4.0 K/uL   Monocytes Relative 7  3 - 12 %   Monocytes Absolute 0.6  0.1 - 1.0 K/uL   Eosinophils Relative 1  0 - 5 %   Eosinophils Absolute 0.1  0.0 - 0.7 K/uL   Basophils Relative 0  0 - 1 %   Basophils Absolute 0.0  0.0 - 0.1 K/uL  COMPREHENSIVE METABOLIC PANEL      Result Value Range   Sodium 139  135 - 145 mEq/L   Potassium 4.3  3.5 - 5.1 mEq/L   Chloride 105  96 - 112 mEq/L   CO2 24  19 - 32 mEq/L   Glucose, Bld 101 (*) 70 - 99 mg/dL   BUN 14  6 - 23 mg/dL   Creatinine, Ser 7.82  0.50 - 1.10 mg/dL   Calcium 9.6  8.4 - 95.6 mg/dL   Total Protein 7.5  6.0 - 8.3 g/dL   Albumin 4.1  3.5 - 5.2 g/dL   AST 13  0 - 37 U/L   ALT 7  0 - 35 U/L   Alkaline Phosphatase 55  39 - 117 U/L   Total Bilirubin 0.2 (*) 0.3 - 1.2 mg/dL   GFR calc non Af Amer >90  >90 mL/min   GFR calc Af Amer >90  >90 mL/min  POCT I-STAT TROPONIN I      Result Value Range   Troponin i, poc 0.03  0.00 - 0.08 ng/mL   Comment 3                 Dg Chest 2 View  10/17/2012   *RADIOLOGY REPORT*  Clinical Data: Chest pain and shortness of breath.  CHEST - 2 VIEW  Comparison: Chest x-ray 10/01/2011.  Findings: Lung volumes are normal.  No consolidative airspace disease.  No pleural effusions.  No pneumothorax.  No pulmonary nodule or mass noted.  Pulmonary vasculature and the cardiomediastinal silhouette are within normal limits.  IMPRESSION: 1. No radiographic evidence of  acute cardiopulmonary disease.   Original Report Authenticated By: Trudie Reed, M.D.  1. Shortness of breath       MDM  10:50PM patient seen and evaluated. Patient appears well in no acute distress. Currently she denies any symptoms of chest pain or shortness of breath. Symptoms feel the same as they have for the past 2 years.  Patient has atypical symptoms. She has normal respirations, heart rate and O2 sats. No clinical signs or other concerning history to suggest PE. She has had multiple evaluations for similar symptoms by specialists including pulmonologists, heart monitor for one month, echocardiogram and cardiology followup, ENT followup without any specific or concerning cause.      Date: 10/17/2012  Rate: 87  Rhythm: normal sinus rhythm  QRS Axis: normal  Intervals: normal  ST/T Wave abnormalities: normal  Conduction Disutrbances:none  Narrative Interpretation:   Old EKG Reviewed: No significant changes from 12/13/2010       Angus Seller, PA-C 10/17/12 2326

## 2012-10-19 NOTE — ED Provider Notes (Signed)
Medical screening examination/treatment/procedure(s) were performed by non-physician practitioner and as supervising physician I was immediately available for consultation/collaboration.   Verbon Giangregorio W Erland Vivas, MD 10/19/12 0510 

## 2012-11-01 ENCOUNTER — Other Ambulatory Visit: Payer: Self-pay | Admitting: Family Medicine

## 2012-11-01 DIAGNOSIS — E049 Nontoxic goiter, unspecified: Secondary | ICD-10-CM

## 2012-11-10 ENCOUNTER — Other Ambulatory Visit: Payer: BC Managed Care – PPO

## 2015-08-14 ENCOUNTER — Other Ambulatory Visit (INDEPENDENT_AMBULATORY_CARE_PROVIDER_SITE_OTHER): Payer: BLUE CROSS/BLUE SHIELD

## 2015-08-14 ENCOUNTER — Ambulatory Visit (INDEPENDENT_AMBULATORY_CARE_PROVIDER_SITE_OTHER): Payer: BLUE CROSS/BLUE SHIELD | Admitting: Internal Medicine

## 2015-08-14 ENCOUNTER — Encounter: Payer: Self-pay | Admitting: Internal Medicine

## 2015-08-14 VITALS — BP 110/78 | HR 78 | Ht 65.0 in | Wt 146.0 lb

## 2015-08-14 DIAGNOSIS — R05 Cough: Secondary | ICD-10-CM | POA: Insufficient documentation

## 2015-08-14 DIAGNOSIS — R058 Other specified cough: Secondary | ICD-10-CM | POA: Insufficient documentation

## 2015-08-14 LAB — CBC WITH DIFFERENTIAL/PLATELET
BASOS ABS: 0.1 10*3/uL (ref 0.0–0.1)
Basophils Relative: 0.8 % (ref 0.0–3.0)
Eosinophils Absolute: 0.1 10*3/uL (ref 0.0–0.7)
Eosinophils Relative: 0.9 % (ref 0.0–5.0)
HCT: 41.1 % (ref 36.0–46.0)
Hemoglobin: 14 g/dL (ref 12.0–15.0)
LYMPHS ABS: 1.9 10*3/uL (ref 0.7–4.0)
LYMPHS PCT: 17.7 % (ref 12.0–46.0)
MCHC: 34.1 g/dL (ref 30.0–36.0)
MCV: 84.1 fl (ref 78.0–100.0)
MONOS PCT: 5.3 % (ref 3.0–12.0)
Monocytes Absolute: 0.6 10*3/uL (ref 0.1–1.0)
NEUTROS PCT: 75.3 % (ref 43.0–77.0)
Neutro Abs: 8.1 10*3/uL — ABNORMAL HIGH (ref 1.4–7.7)
Platelets: 194 10*3/uL (ref 150.0–400.0)
RBC: 4.89 Mil/uL (ref 3.87–5.11)
RDW: 13.1 % (ref 11.5–15.5)
WBC: 10.7 10*3/uL — AB (ref 4.0–10.5)

## 2015-08-14 MED ORDER — OXYCODONE HCL 5 MG PO TABS: 5.0000 mg | ORAL_TABLET | ORAL | Status: DC | PRN

## 2015-08-14 MED ORDER — FAMOTIDINE 20 MG PO TABS: ORAL_TABLET | ORAL | Status: DC

## 2015-08-14 MED ORDER — PREDNISONE 10 MG PO TABS: ORAL_TABLET | ORAL | Status: DC

## 2015-08-14 MED ORDER — PANTOPRAZOLE SODIUM 40 MG PO TBEC: 40.0000 mg | DELAYED_RELEASE_TABLET | Freq: Every day | ORAL | Status: DC

## 2015-08-14 NOTE — Assessment & Plan Note (Signed)
The most common causes of chronic cough in immunocompetent adults include the following: upper airway cough syndrome (UACS), previously referred to as postnasal drip syndrome (PNDS), which is caused by variety of rhinosinus conditions; (2) asthma; (3) GERD; (4) chronic bronchitis from cigarette smoking or other inhaled environmental irritants; (5) nonasthmatic eosinophilic bronchitis; and (6) bronchiectasis.   These conditions, singly or in combination, have accounted for up to 94% of the causes of chronic cough in prospective studies.   Other conditions have constituted no >6% of the causes in prospective studies These have included bronchogenic carcinoma, chronic interstitial pneumonia, sarcoidosis, left ventricular failure, ACEI-induced cough, and aspiration from a condition associated with pharyngeal dysfunction.    Chronic cough is often simultaneously caused by more than one condition. A single cause has been found from 38 to 82% of the time, multiple causes from 18 to 62%. Multiply caused cough has been the result of three diseases up to 42% of the time.       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   These are the same pts (now being labeled as having "irritable larynx syndrome" by some cough centers) who not infrequently have a history of having failed to tolerate ace inhibitors,  dry powder inhalers or biphosphonates or report having atypical reflux symptoms that don't respond to standard doses of PPI , and are easily confused as  having aecopd or asthma flares by even experienced allergists/ pulmonologists.   The first step is to maximize acid suppression and eliminate cyclical coughing/ w/u for sinus dz/allergy  then regroup if the cough persists.  I had an extended discussion with the patient reviewing all relevant studies completed to date and  lasting 35 min of a 60 min initial   Each maintenance medication was reviewed in detail including most importantly the difference between maintenance and prns and under what circumstances the prns are to be triggered using an action plan format that is not reflected in the computer generated alphabetically organized AVS.    Please see instructions for details which were reviewed in writing and the patient given a copy highlighting the part that I personally wrote and discussed at today's ov.   See instructions for specific recommendations which were reviewed directly with the patient who was given a copy with highlighter outlining the key components.

## 2015-08-14 NOTE — Progress Notes (Signed)
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  08/14/2015 1st [facility] office visit/ Lindsey Foley   Chief Complaint  Patient presents with  . Pulmonary Consult    Referred by Dr. Marjory Lies. Pt c/o cough x 3 months- prod, but sputum gets stuck in her throat. Cough seems to be worse first thing in the am.  She also c/o sinus pressure/HA, ears feel full.     Cough to point of gag but no vomit/ does not wake her up > no excess mucus/ occ nasal drainage watery  Only rx = augmentin/ flonase  No obvious other patterns in day to day or daytime variabilty or assoc sob or cp or chest tightness, subjective wheeze or hb symptoms. No unusual exp hx or h/o childhood pna/ asthma or knowledge of premature birth.  Sleeping ok without nocturnal  or early am exacerbation  of respiratory  c/o's or need for noct saba. Also denies any obvious fluctuation of symptoms with weather or environmental changes or other aggravating or alleviating factors except as outlined above   Current Medications, Allergies, Complete Past Medical History, Past Surgical History, Family History, and Social History were reviewed in [employer] record.              Review of Systems  Constitutional: Negative for fever, chills and unexpected weight change.  HENT: Positive for congestion, ear pain, postnasal drip and sneezing. Negative for dental problem, nosebleeds, rhinorrhea, sinus pressure, sore throat, trouble swallowing and voice change.   Eyes: Negative for visual disturbance.  Respiratory: Positive for cough. Negative for choking and shortness of  breath.   Cardiovascular: Negative for chest pain and leg swelling.  Gastrointestinal: Negative for vomiting, abdominal pain and diarrhea.  Genitourinary: Negative for difficulty urinating.  Musculoskeletal: Negative for arthralgias.  Skin: Negative for rash.  Neurological: Positive for headaches. Negative for tremors and syncope.  Hematological: Does not bruise/bleed easily.       Objective:   Physical Exam   amb wf with nasal tone to voice  Wt Readings from Last 3 Encounters:  08/14/15 146 lb (66.225 kg)  11/05/11 137 lb (62.143 kg)  10/01/11 136 lb (61.689 kg)    Vital signs reviewed  HEENT: nl dentition, turbinates, and oropharynx. Nl external ear canals without cough reflex   NECK :  without JVD/Nodes/TM/ nl carotid upstrokes bilaterally   LUNGS: no acc muscle use,  Nl contour chest which is clear to A and P bilaterally without cough on insp or exp maneuvers   CV:  RRR  no s3 or murmur or increase in P2, no edema   ABD:  soft and nontender with nl inspiratory excursion in the supine position. No bruits or organomegaly, bowel sounds nl  MS:  Nl gait/ ext warm without deformities, calf tenderness, cyanosis or clubbing No obvious joint restrictions   SKIN: warm and dry without lesions    NEURO:  alert, approp, nl sensorium with  no motor deficits     I personally reviewed images and agree with radiology impression as follows:  CXR:  07/30/15  wnl  labs 08/14/2015 allergy profile/ eos       Assessment & Plan:

## 2015-08-14 NOTE — Patient Instructions (Addendum)
Please see patient coordinator before you leave today  to schedule sinus CT - if neg then For drainage / throat tickle try take CHLORPHENIRAMINE  4 mg - take one every 4 hours as needed - available over the counter- may cause drowsiness so start with just a bedtime dose or two and see how you tolerate it before trying in daytime    For nasal /ear stuffiness > advil cold and sinus  Please remember to go to the lab  department downstairs for your tests - we will call you with the results when they are available.  The key to effective treatment for your cough is eliminating the non-stop cycle of cough you're stuck in long enough to let your airway heal completely and then see if there is anything still making you cough once you stop the cough suppression, but this should take no more than 5 days to figure out  First take delsym two tsp every 12 hours and supplement if needed with  roxicodone  1-2 every 4 hours to suppress the urge to cough at all or even clear your throat. Swallowing water or using ice chips/non mint and menthol containing candies (such as lifesavers or sugarless jolly ranchers) are also effective.  You should rest your voice and avoid activities that you know make you cough.  Once you have eliminated the cough for 3 straight days try reducing the roxicodone first,  then the delsym as tolerated.    Prednisone 10 mg take  4 each am x 2 days,   2 each am x 2 days,  1 each am x 2 days and stop (this is to eliminate allergies and inflammation from coughing)  Protonix (pantoprazole) Take 30-60 min before first meal of the day and Pepcid 20 mg one bedtime plus chlorpheniramine 4 mg x 2 at bedtime (both available over the counter)  until cough is completely gone for at least a week without the need for cough suppression  GERD (REFLUX)  is an extremely common cause of respiratory symptoms, many times with no significant heartburn at all.    It can be treated with medication, but also with  lifestyle changes including avoidance of late meals, excessive alcohol, smoking cessation, and avoid fatty foods, chocolate, peppermint, colas, red wine, and acidic juices such as orange juice.  NO MINT OR MENTHOL PRODUCTS SO NO COUGH DROPS  USE HARD CANDY INSTEAD (jolley ranchers or Stover's or Lifesavers (all available in sugarless versions) NO OIL BASED VITAMINS - use powdered substitutes.      Please schedule a follow up office visit in 2 weeks, sooner if needed - ok to cancel if doing great - bring all medications

## 2015-08-15 LAB — RESPIRATORY ALLERGY PROFILE REGION II ~~LOC~~
Allergen, Oak,t7: <0.1 kU/L
Alternaria Alternata: <0.1 kU/L
Cat Dander: <0.1 kU/L
Cladosporium Herbarum: <0.1 kU/L
D. farinae: <0.1 kU/L
Dog Dander: <0.1 kU/L
IGE (IMMUNOGLOBULIN E), SERUM: 11 kU/L (ref <115)
Johnson Grass: <0.1 kU/L
Pecan/Hickory Tree IgE: <0.1 kU/L
Rough Pigweed  IgE: <0.1 kU/L
Timothy Grass: <0.1 kU/L

## 2015-08-18 ENCOUNTER — Ambulatory Visit (HOSPITAL_COMMUNITY)
Admission: RE | Admit: 2015-08-18 | Discharge: 2015-08-18 | Disposition: A | Discharge status: Home / Self Care | Payer: BLUE CROSS/BLUE SHIELD | Source: Ambulatory Visit | Attending: Internal Medicine | Admitting: Internal Medicine

## 2015-08-18 ENCOUNTER — Telehealth: Payer: Self-pay | Admitting: Internal Medicine

## 2015-08-18 DIAGNOSIS — J342 Deviated nasal septum: Secondary | ICD-10-CM | POA: Insufficient documentation

## 2015-08-18 DIAGNOSIS — J329 Chronic sinusitis, unspecified: Secondary | ICD-10-CM | POA: Diagnosis not present

## 2015-08-18 DIAGNOSIS — R05 Cough: Secondary | ICD-10-CM | POA: Insufficient documentation

## 2015-08-18 DIAGNOSIS — R058 Other specified cough: Secondary | ICD-10-CM

## 2015-08-18 MED ORDER — AMOXICILLIN-POT CLAVULANATE 875-125 MG PO TABS: 1.0000 | ORAL_TABLET | Freq: Two times a day (BID) | ORAL | Status: DC

## 2015-08-18 NOTE — Progress Notes (Signed)
Quick Note:  Spoke with pt and notified of results per Dr. Wert. Pt verbalized understanding and denied any questions.  ______ 

## 2015-08-18 NOTE — Progress Notes (Signed)
Quick Note:  LMTCB ______ 

## 2015-08-18 NOTE — Telephone Encounter (Signed)
I spoke with the pt regarding her lab and ct sinus results Nothing further needed

## 2015-08-22 ENCOUNTER — Institutional Professional Consult (permissible substitution): Payer: Self-pay | Admitting: Emergency Medicine

## 2015-08-28 ENCOUNTER — Ambulatory Visit (INDEPENDENT_AMBULATORY_CARE_PROVIDER_SITE_OTHER): Payer: BLUE CROSS/BLUE SHIELD | Admitting: Internal Medicine

## 2015-08-28 ENCOUNTER — Encounter: Payer: Self-pay | Admitting: Internal Medicine

## 2015-08-28 VITALS — BP 114/80 | HR 72 | Ht 65.0 in | Wt 145.6 lb

## 2015-08-28 DIAGNOSIS — R05 Cough: Secondary | ICD-10-CM | POA: Diagnosis not present

## 2015-08-28 DIAGNOSIS — R058 Other specified cough: Secondary | ICD-10-CM

## 2015-08-28 MED ORDER — PREDNISONE 10 MG PO TABS: ORAL_TABLET | ORAL | Status: DC

## 2015-08-28 NOTE — Progress Notes (Signed)
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 Pulmonary Consult    Referred by Dr. Marjory Foley. Pt c/o cough x 3 months- prod, but sputum gets stuck in her throat. Cough seems to be worse first thing in the am.  She also c/o sinus pressure/HA, ears feel full.    Cough to point of gag but no vomit/ does not wake her up > no excess mucus/ occ nasal drainage watery  Only rx = augmentin/ flonase rec Please see patient coordinator before you leave today  to schedule sinus CT > bilateral max sinusitis> augmentin x 20 days  For nasal /ear stuffiness > advil cold and sinus  First take delsym two tsp every 12 hours and supplement if needed with  roxicodone  1-2 every 4 hours  Once you have eliminated the cough for 3 straight days try reducing the roxicodone first,  then the delsym as tolerated.   Prednisone 10 mg take  4 each am x 2 days,   2 each am x 2 days,  1 each am x 2 days and stop (this is to eliminate allergies and inflammation from coughing) Protonix (pantoprazole) Take 30-60 min before first meal of the day and Pepcid 20 mg one bedtime  GERD diet     08/28/2015  f/u ov/Lindsey Foley re: uacs with sinusitis with persistent symptoms since Jan 2017 / finishing 20 d augmentin and still on ppi/h2hs  Chief Complaint  Patient presents with  . Follow-up    Cough has not improved at all. She started having sore throat last night. She is also c/o vertigo.   only took 2 norco x one  "knocked me out" has full bottle of norco left, still sleeping fine then cough p 10 min stirring around with sensation something is stuck in her throat never cleared with violent coughing but no obvious rhinorrea or sob   No obvious day to day or daytime variability or assoc  cp or chest tightness, subjective wheeze or overt s hb symptoms. No unusual exp hx or h/o childhood pna/ asthma or knowledge of premature birth.  Sleeping ok without nocturnal  or early am exacerbation  of respiratory  c/o's or need for noct saba. Also denies any obvious fluctuation of symptoms with weather or environmental changes or other aggravating or alleviating factors except as outlined above   Current Medications, Allergies, Complete Past Medical History, Past Surgical History, Family History, and Social History were reviewed in [employer] record.  ROS  The following are not active complaints unless bolded sore throat, dysphagia, dental problems, itching, sneezing,  nasal congestion or excess/ purulent secretions, ear ache,   fever, chills, sweats, unintended wt loss, classically pleuritic or exertional cp, hemoptysis,  orthopnea pnd or leg swelling, presyncope, palpitations, abdominal pain, anorexia, nausea, vomiting, diarrhea  or change in bowel or bladder habits, change in stools or  urine, dysuria,hematuria,  rash, arthralgias, visual complaints, headache, numbness, weakness or ataxia or problems with walking or coordination,  change in mood/affect or memory.                       Objective:   Physical Exam   amb wf with nasal tone to voice and very loud voice pattern / throat clearing   08/28/2015        146   08/14/15 146 lb (66.225 kg)  11/05/11 137 lb (62.143 kg)  10/01/11 136 lb (61.689 kg)    Vital signs reviewed  HEENT: nl dentition, turbinates, and oropharynx which is pristine . Nl external ear canals without cough reflex   NECK :  without JVD/Nodes/TM/ nl carotid  upstrokes bilaterally   LUNGS: no acc muscle use,  Nl contour chest which is clear to A and P bilaterally without cough on insp or exp maneuvers   CV:  RRR  no s3 or murmur or increase in P2, no edema   ABD:  soft and nontender with nl inspiratory excursion in the supine position. No bruits or organomegaly, bowel sounds nl  MS:  Nl gait/ ext warm without deformities, calf tenderness, cyanosis or clubbing No obvious joint restrictions   SKIN: warm and dry without lesions    NEURO:  alert, approp, nl sensorium with  no motor deficits     I personally reviewed images and agree with radiology impression as follows:  CXR:  07/30/15  wnl        Assessment & Plan:

## 2015-08-28 NOTE — Patient Instructions (Addendum)
Finish all your augmentin and plan on seeing Dr Suszanne Connerseoh in the next few weeks    For nasal /ear stuffiness > advil cold and sinus   The key to effective treatment for your cough is eliminating the non-stop cycle of cough you're stuck in long enough to let your airway heal completely and then see if there is anything still making you cough once you stop the cough suppression, but this should take no more than 5 days to figure out  First take delsym two tsp every 12 hours and supplement if needed with  roxicodone  1-2 every 4 hours to suppress the urge to cough at all or even clear your throat. Swallowing water or using ice chips/non mint and menthol containing candies (such as lifesavers or sugarless jolly ranchers) are also effective.  You should rest your voice and avoid activities that you know make you cough.  Once you have eliminated the cough for 3 straight days try reducing the roxicodone first,  then the delsym as tolerated.    Prednisone 10 mg take  4 each am x 2 days,   2 each am x 2 days,  1 each am x 2 days and stop (this is to eliminate allergies and inflammation from coughing)  Protonix (pantoprazole) Take 30-60 min before first meal of the day and Pepcid 20 mg one bedtime plus chlorpheniramine 4 mg x 2 at bedtime (both available over the counter)  until cough is completely gone for at least a week without the need for cough suppression   GERD (REFLUX)  is an extremely common cause of respiratory symptoms, many times with no significant heartburn at all.    It can be treated with medication, but also with lifestyle changes including avoidance of late meals, excessive alcohol, smoking cessation, and avoid fatty foods, chocolate, peppermint, colas, red wine, and acidic juices such as orange juice.  NO MINT OR MENTHOL PRODUCTS SO NO COUGH DROPS  USE HARD CANDY INSTEAD (jolley ranchers or Stover's or Lifesavers (all available in sugarless versions) NO OIL BASED VITAMINS - use powdered  substitutes.   Pulmonary follow up is as needed

## 2015-08-30 NOTE — Assessment & Plan Note (Signed)
Allergy profile 08/14/2015 >  Eos 0.1 /  IgE 11 with neg RAST  - sinus CT 08/18/2015 > Sinus disease as above with small BILATERAL maxillary sinus air-fluid levels larger on RIGHT > rec augmentin x 20 days > ov 08/28/2015 no better rec complete the augmentin and f/u [facility]    Classic Upper airway cough syndrome, so named because it's frequently impossible to sort out how much is  CR/sinusitis with freq throat clearing (which can be related to primary GERD)   vs  causing  secondary (" extra esophageal")  GERD from wide swings in gastric pressure that occur with throat clearing, often  promoting self use of mint and menthol lozenges that reduce the lower esophageal sphincter tone and exacerbate the problem further in a cyclical fashion.   These are the same pts (now being labeled as having "irritable larynx syndrome" by some cough centers) who not infrequently have a history of having failed to tolerate ace inhibitors,  dry powder inhalers or biphosphonates or report having atypical reflux symptoms that don't respond to standard doses of PPI , and are easily confused as having aecopd or asthma flares by even experienced allergists/ pulmonologists.   Key is to eliminate excess throat clearing and continue gerd rx until all pnds cleared plus 1 week to prevent cyclical cough from perpetuating.  [facility] and if not all better next step [facility] Delford FieldWright for irritable larynx syndrome  I had an extended discussion with the patient reviewing all relevant studies completed to date and  lasting 15 to 20 minutes of a 25 minute visit    Each maintenance medication was reviewed in detail including most importantly the difference between maintenance and prns and under what circumstances the prns are to be triggered using an action plan format that is not reflected in the computer generated alphabetically organized AVS.    Please see instructions for details which were reviewed in writing  and the patient given a copy highlighting the part that I personally wrote and discussed at today's ov.   Pulmonary f/u is prn

## 2016-11-06 IMAGING — CT CT PARANASAL SINUSES LIMITED
1 of 2 series · 13 of 30 positions shown, 17 images · non-contrast
Comparison: 10/14/2011 orbital CT

CLINICAL DATA: Cough and nasal congestion for 1 month that will not
go away, upper airway cough syndrome

EXAM:
CT PARANASAL SINUS LIMITED WITHOUT CONTRAST
TECHNIQUE: Non-contiguous multidetector CT images of the paranasal sinuses were
obtained in a single coronal plane without contrast. Right side of
face marked with BB.

[Series 3: sinus ltd 1.8 u70u · axial · 0.29mm/px · z∈[+70,+160]mm · 13 of 22 slices shown, 17 images]
[im 2/22  brain]
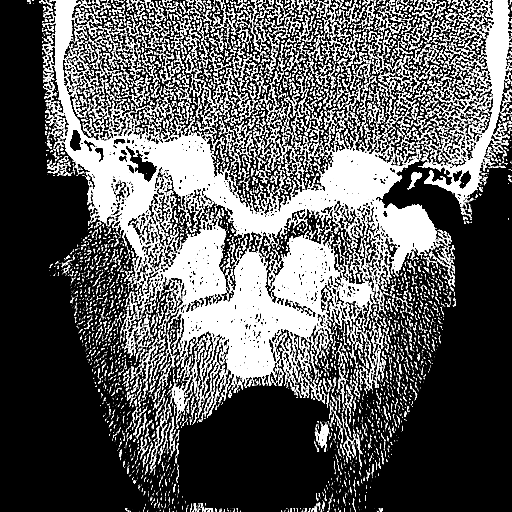
[im 2/22  bone]
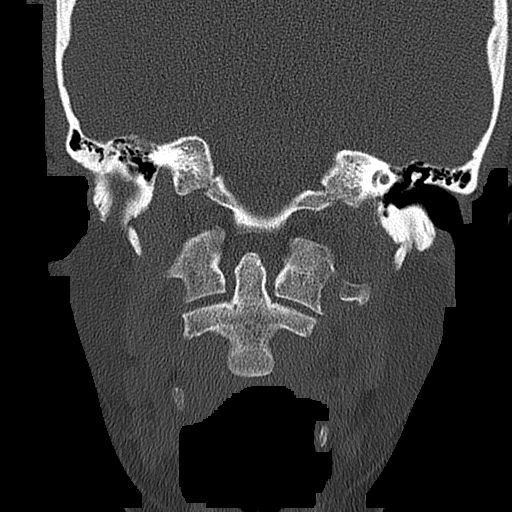
[im 4/22  bone]
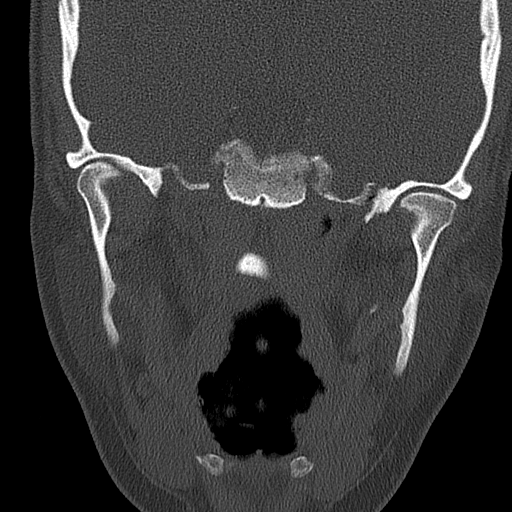
[im 5/22  bone]
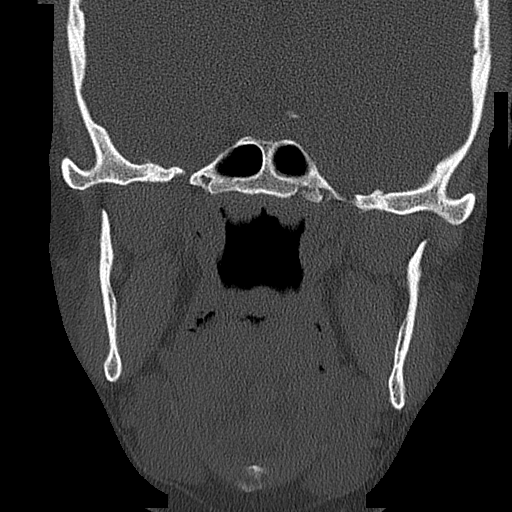
[im 7/22  bone]
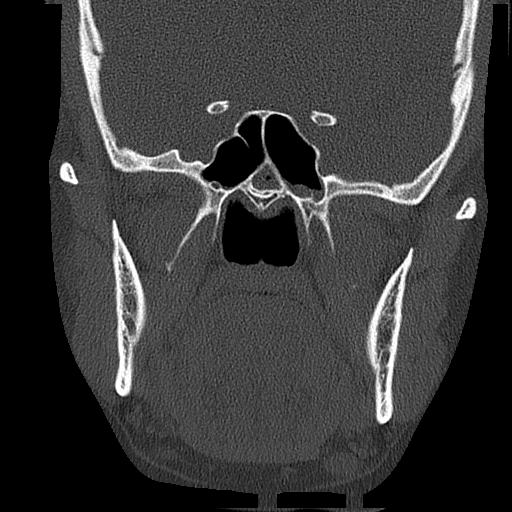
[im 8/22  brain]
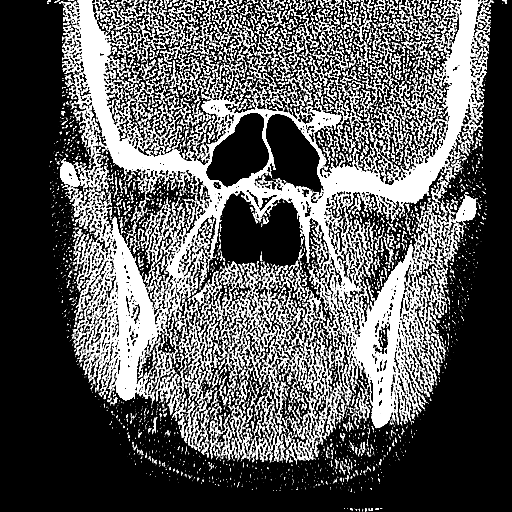
[im 8/22  bone]
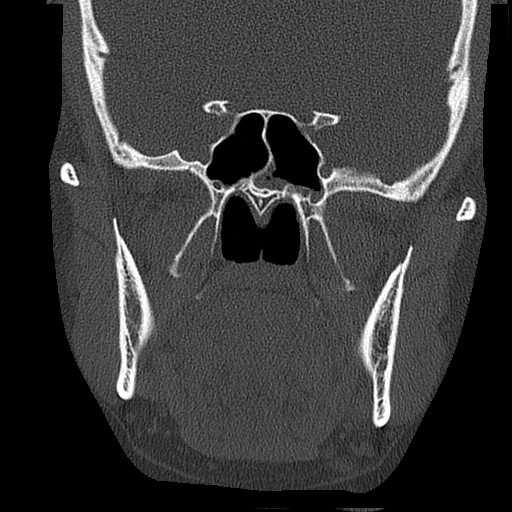
[im 10/22  bone]
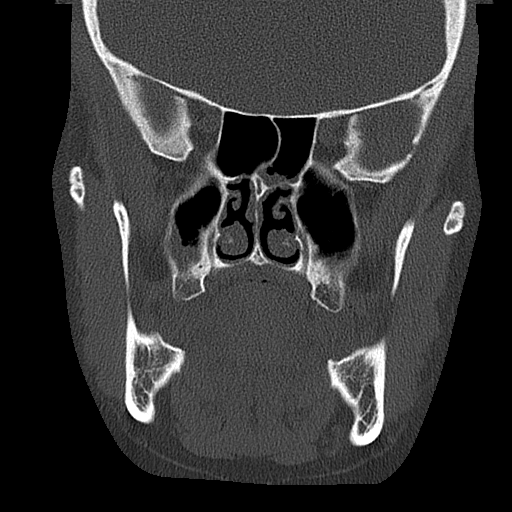
[im 11/22  bone]
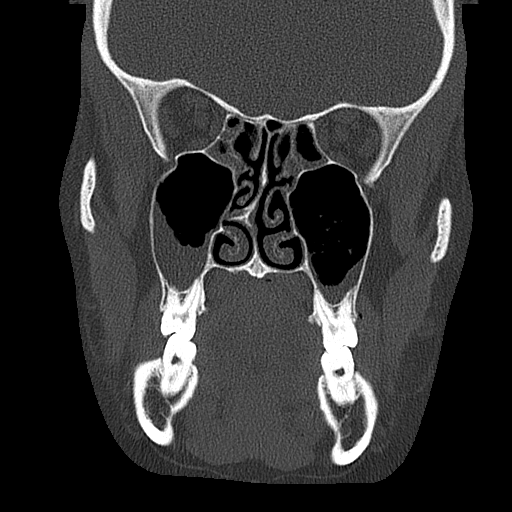
[im 13/22  bone]
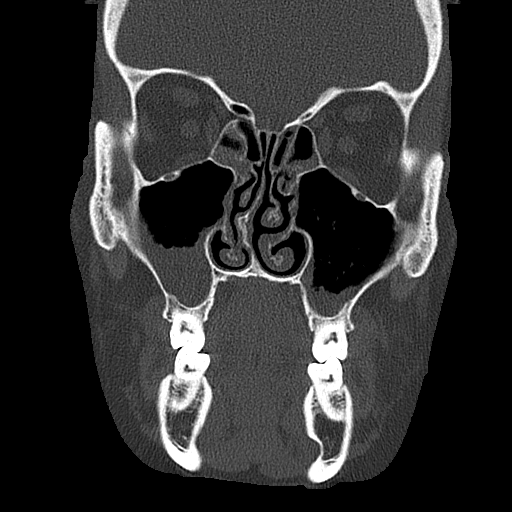
[im 14/22  brain]
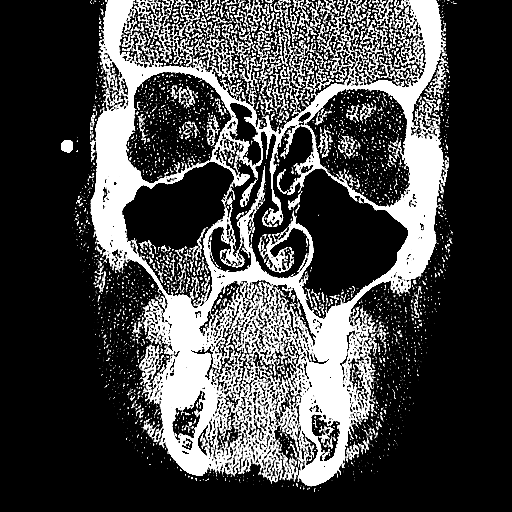
[im 14/22  bone]
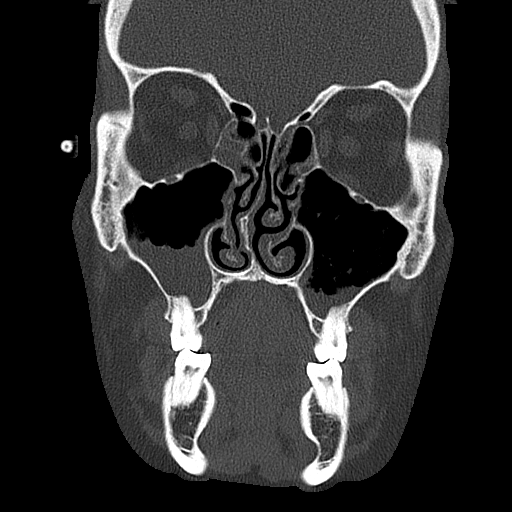
[im 16/22  bone]
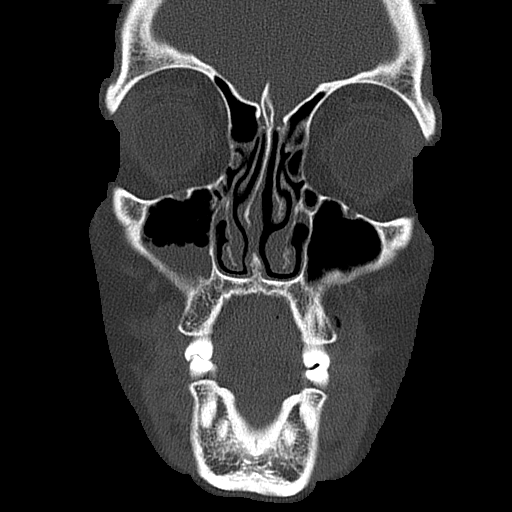
[im 17/22  bone]
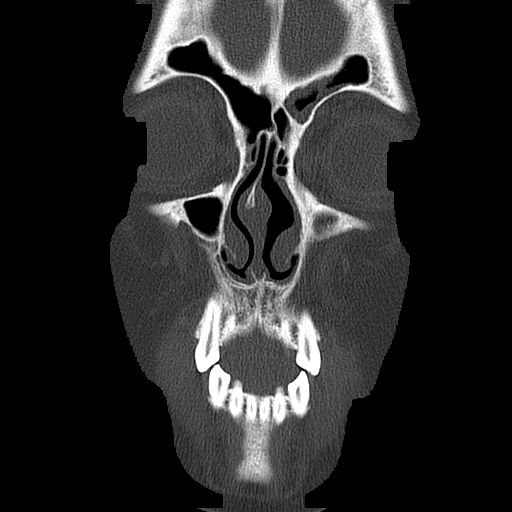
[im 19/22  bone]
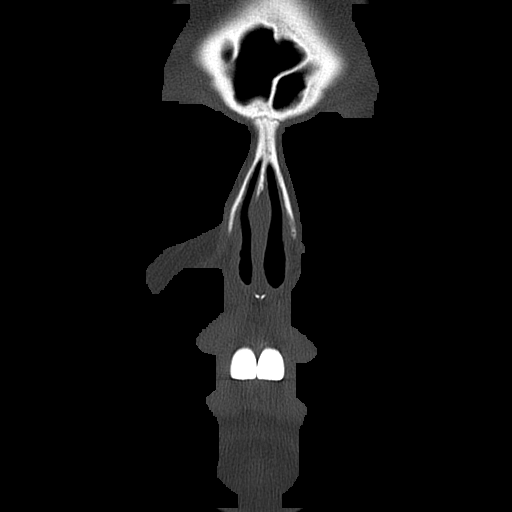
[im 20/22  brain]
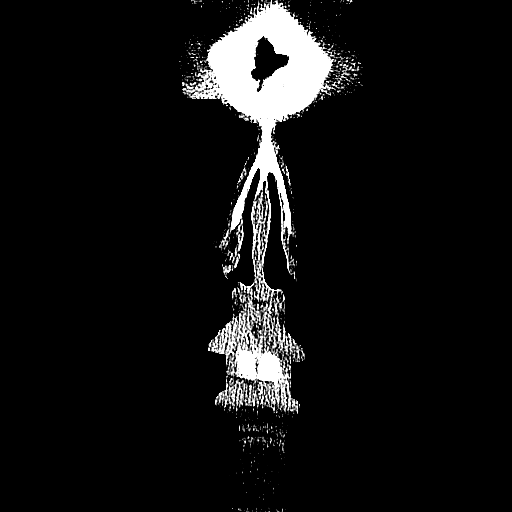
[im 20/22  bone]
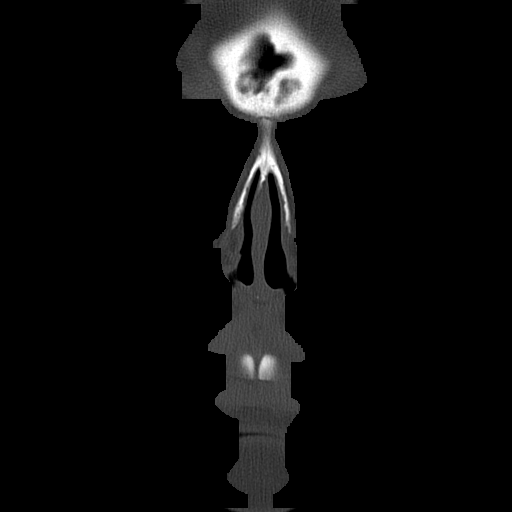

[13 of 30 positions shown; findings below may reference images not displayed]

FINDINGS: Nasal septal deviation to the RIGHT.

BILATERAL maxillary sinus air-fluid levels larger on RIGHT.

Scattered mucosal thickening in ethmoid air cells and frontal sinus.

No fracture or bone destruction.
IMPRESSION: Sinus disease as above with small BILATERAL maxillary sinus
air-fluid levels larger on RIGHT.

Nasal septal deviation to the RIGHT.

## 2020-11-19 ENCOUNTER — Other Ambulatory Visit: Payer: Self-pay | Admitting: Family Medicine

## 2020-11-19 DIAGNOSIS — E890 Postprocedural hypothyroidism: Secondary | ICD-10-CM

## 2020-12-01 ENCOUNTER — Other Ambulatory Visit: Payer: BLUE CROSS/BLUE SHIELD

## 2020-12-03 ENCOUNTER — Ambulatory Visit
Admission: RE | Admit: 2020-12-03 | Discharge: 2020-12-03 | Disposition: A | Discharge status: Home / Self Care | Payer: BLUE CROSS/BLUE SHIELD | Source: Ambulatory Visit | Attending: Family Medicine | Admitting: Family Medicine

## 2020-12-03 DIAGNOSIS — E890 Postprocedural hypothyroidism: Secondary | ICD-10-CM

## 2021-12-11 ENCOUNTER — Emergency Department (HOSPITAL_BASED_OUTPATIENT_CLINIC_OR_DEPARTMENT_OTHER)
Admission: EM | Admit: 2021-12-11 | Discharge: 2021-12-11 | Disposition: A | Discharge status: Home / Self Care | Payer: Managed Care, Other (non HMO) | Attending: Emergency Medicine | Admitting: Emergency Medicine

## 2021-12-11 ENCOUNTER — Emergency Department (HOSPITAL_BASED_OUTPATIENT_CLINIC_OR_DEPARTMENT_OTHER): Payer: Managed Care, Other (non HMO)

## 2021-12-11 ENCOUNTER — Other Ambulatory Visit: Payer: Self-pay

## 2021-12-11 ENCOUNTER — Encounter (HOSPITAL_BASED_OUTPATIENT_CLINIC_OR_DEPARTMENT_OTHER): Payer: Self-pay | Admitting: Emergency Medicine

## 2021-12-11 DIAGNOSIS — N83202 Unspecified ovarian cyst, left side: Secondary | ICD-10-CM | POA: Insufficient documentation

## 2021-12-11 DIAGNOSIS — R064 Hyperventilation: Secondary | ICD-10-CM | POA: Diagnosis not present

## 2021-12-11 DIAGNOSIS — N83201 Unspecified ovarian cyst, right side: Secondary | ICD-10-CM | POA: Insufficient documentation

## 2021-12-11 DIAGNOSIS — R1030 Lower abdominal pain, unspecified: Secondary | ICD-10-CM | POA: Diagnosis present

## 2021-12-11 LAB — URINALYSIS, ROUTINE W REFLEX MICROSCOPIC
Bilirubin Urine: NEGATIVE
Glucose, UA: NEGATIVE mg/dL
Hgb urine dipstick: NEGATIVE
Ketones, ur: NEGATIVE mg/dL
Leukocytes,Ua: NEGATIVE
Nitrite: NEGATIVE
Protein, ur: NEGATIVE mg/dL
Specific Gravity, Urine: 1.02 (ref 1.005–1.030)
pH: 7 (ref 5.0–8.0)

## 2021-12-11 LAB — CBC WITH DIFFERENTIAL/PLATELET
Abs Immature Granulocytes: 0.04 10*3/uL (ref 0.00–0.07)
Basophils Absolute: 0.1 10*3/uL (ref 0.0–0.1)
Basophils Relative: 1 %
Eosinophils Absolute: 0.1 10*3/uL (ref 0.0–0.5)
Eosinophils Relative: 1 %
HCT: 35.7 % — ABNORMAL LOW (ref 36.0–46.0)
Hemoglobin: 12.4 g/dL (ref 12.0–15.0)
Immature Granulocytes: 1 %
Lymphocytes Relative: 24 %
Lymphs Abs: 2.1 10*3/uL (ref 0.7–4.0)
MCH: 29 pg (ref 26.0–34.0)
MCHC: 34.7 g/dL (ref 30.0–36.0)
MCV: 83.4 fL (ref 80.0–100.0)
Monocytes Absolute: 0.5 10*3/uL (ref 0.1–1.0)
Monocytes Relative: 6 %
Neutro Abs: 5.8 10*3/uL (ref 1.7–7.7)
Neutrophils Relative %: 67 %
Platelets: 191 10*3/uL (ref 150–400)
RBC: 4.28 MIL/uL (ref 3.87–5.11)
RDW: 12.6 % (ref 11.5–15.5)
WBC: 8.5 10*3/uL (ref 4.0–10.5)
nRBC: 0 % (ref 0.0–0.2)

## 2021-12-11 LAB — I-STAT VENOUS BLOOD GAS, ED
Acid-base deficit: 2 mmol/L (ref 0.0–2.0)
Bicarbonate: 21.5 mmol/L (ref 20.0–28.0)
Calcium, Ion: 1.15 mmol/L (ref 1.15–1.40)
HCT: 38 % (ref 36.0–46.0)
Hemoglobin: 12.9 g/dL (ref 12.0–15.0)
O2 Saturation: 82 %
Patient temperature: 98.5
Potassium: 3.6 mmol/L (ref 3.5–5.1)
Sodium: 140 mmol/L (ref 135–145)
TCO2: 22 mmol/L (ref 22–32)
pCO2, Ven: 32 mmHg — ABNORMAL LOW (ref 44–60)
pH, Ven: 7.435 — ABNORMAL HIGH (ref 7.25–7.43)
pO2, Ven: 44 mmHg (ref 32–45)

## 2021-12-11 LAB — BASIC METABOLIC PANEL
Anion gap: 10 (ref 5–15)
BUN: 12 mg/dL (ref 6–20)
CO2: 22 mmol/L (ref 22–32)
Calcium: 8.4 mg/dL — ABNORMAL LOW (ref 8.9–10.3)
Chloride: 108 mmol/L (ref 98–111)
Creatinine, Ser: 0.77 mg/dL (ref 0.44–1.00)
GFR, Estimated: >60 mL/min (ref >60)
Glucose, Bld: 115 mg/dL — ABNORMAL HIGH (ref 70–99)
Potassium: 3.7 mmol/L (ref 3.5–5.1)
Sodium: 140 mmol/L (ref 135–145)

## 2021-12-11 LAB — HCG, SERUM, QUALITATIVE: Preg, Serum: NEGATIVE

## 2021-12-11 MED ORDER — HYDROCODONE-ACETAMINOPHEN 5-325 MG PO TABS: 1.0000 | ORAL_TABLET | Freq: Four times a day (QID) | ORAL | 0 refills | Status: AC | PRN

## 2021-12-11 MED ORDER — ONDANSETRON HCL 4 MG/2ML IJ SOLN
4.0000 mg | Freq: Once | INTRAMUSCULAR | Status: AC
  Administered 2021-12-11: 4 mg via INTRAVENOUS
  Filled 2021-12-11: qty 2

## 2021-12-11 MED ORDER — FENTANYL CITRATE PF 50 MCG/ML IJ SOSY
100.0000 ug | PREFILLED_SYRINGE | Freq: Once | INTRAMUSCULAR | Status: AC
  Administered 2021-12-11: 100 ug via INTRAVENOUS
  Filled 2021-12-11: qty 2

## 2021-12-11 NOTE — ED Triage Notes (Signed)
  Patient comes in with lower abdominal pain that woke her up from sleep around 0320.  Patient states she has pain on RLQ and LLQ that radiates to middle.  No N/V.  Pain 9/10.

## 2021-12-11 NOTE — ED Notes (Signed)
RT ran VBG with the following results. MD notified of results.    Latest Reference Range & Units 12/11/21 04:33  Sample type  VENOUS  pH, Ven 7.25 - 7.43  7.435 (H)  pCO2, Ven 44 - 60 mmHg 32.0 (L)  pO2, Ven 32 - 45 mmHg 44  TCO2 22 - 32 mmol/L 22  Acid-base deficit 0.0 - 2.0 mmol/L 2.0  Bicarbonate 20.0 - 28.0 mmol/L 21.5  O2 Saturation % 82  Patient temperature  98.5 F  Collection site  IV start

## 2021-12-11 NOTE — ED Provider Notes (Signed)
[facility] Provider Note: Lowella Dell, MD, FACEP  CSN: [id number] MRN: [id number] ARRIVAL: 12/11/21 at 0407 ROOM: DB012/DB012   CHIEF COMPLAINT  Abdominal Pain   HISTORY OF PRESENT ILLNESS  12/11/21 4:10 AM Lindsey Foley is a 41 y.o. female who woke just prior to arrival with severe suprapubic pain.  She rates it as a 9 out of 10.  It is not significantly worse with movement or palpation.  She denies nausea, vomiting or diarrhea.  Her last menstrual period was about 2 weeks ago.  She is not having urinary symptoms.  On the way over here she began hyperventilating and on arrival is having cramping and pain in her hands but not feet.   Past Medical History:  Diagnosis Date   Thyroid disease    Vertigo     Past Surgical History:  Procedure Laterality Date   THYROID LOBECTOMY  12/02/10   THYROID SURGERY  12/18/2010    Family History  Problem Relation Age of Onset   Rheum arthritis Maternal Grandmother     Social History   Tobacco Use   Smoking status: Never   Smokeless tobacco: Never  Substance Use Topics   Alcohol use: Yes    Alcohol/week: 0.0 standard drinks of alcohol    Comment: once or twice per week   Drug use: No    Prior to Admission medications   Medication Sig Start Date End Date Taking? Authorizing Provider  HYDROcodone-acetaminophen (NORCO) 5-325 MG tablet Take 1-2 tablets by mouth every 6 (six) hours as needed for severe pain or moderate pain. 12/11/21  Yes Sarayah Bacchi, MD    Allergies Codeine   REVIEW OF SYSTEMS  Negative except as noted here or in the History of Present Illness.   PHYSICAL EXAMINATION  Initial Vital Signs Blood pressure (!) 145/75, pulse 86, temperature 98.5 F (36.9 C), temperature source Oral, resp. rate 18, height 5\' 5"  (1.651 m), weight 65.8 kg, SpO2 100 %.  Examination General: Well-developed, well-nourished female in no acute distress; appearance consistent with age of record HENT: normocephalic;  atraumatic Eyes: Normal appearance Neck: supple Heart: regular rate and rhythm Lungs: clear to auscultation bilaterally Abdomen: soft; nondistended; mild right suprapubic tenderness; bowel sounds present Extremities: No deformity; full range of motion; pulses normal; contractures of hands Neurologic: Awake, alert and oriented; motor function intact in all extremities and symmetric; no facial droop Skin: Warm and dry Psychiatric: Grimacing   RESULTS  Summary of this visit's results, reviewed and interpreted by myself:   EKG Interpretation  Date/Time:    Ventricular Rate:    PR Interval:    QRS Duration:   QT Interval:    QTC Calculation:   R Axis:     Text Interpretation:         Laboratory Studies: Results for orders placed or performed during the hospital encounter of 12/11/21 (from the past 24 hour(s))  hCG, serum, qualitative     Status: None   Collection Time: 12/11/21  4:32 AM  Result Value Ref Range   Preg, Serum NEGATIVE NEGATIVE  I-Stat venous blood gas, ([facility] ED only)     Status: Abnormal   Collection Time: 12/11/21  4:33 AM  Result Value Ref Range   pH, Ven 7.435 (H) 7.25 - 7.43   pCO2, Ven 32.0 (L) 44 - 60 mmHg   pO2, Ven 44 32 - 45 mmHg   Bicarbonate 21.5 20.0 - 28.0 mmol/L   TCO2 22 22 - 32 mmol/L   O2 Saturation  82 %   Acid-base deficit 2.0 0.0 - 2.0 mmol/L   Sodium 140 135 - 145 mmol/L   Potassium 3.6 3.5 - 5.1 mmol/L   Calcium, Ion 1.15 1.15 - 1.40 mmol/L   HCT 38.0 36.0 - 46.0 %   Hemoglobin 12.9 12.0 - 15.0 g/dL   Patient temperature 37.9 F    Collection site IV start    Drawn by Nurse    Sample type VENOUS   CBC with Differential     Status: Abnormal   Collection Time: 12/11/21  4:35 AM  Result Value Ref Range   WBC 8.5 4.0 - 10.5 K/uL   RBC 4.28 3.87 - 5.11 MIL/uL   Hemoglobin 12.4 12.0 - 15.0 g/dL   HCT 02.4 (L) 09.7 - 35.3 %   MCV 83.4 80.0 - 100.0 fL   MCH 29.0 26.0 - 34.0 pg   MCHC 34.7 30.0 - 36.0 g/dL   RDW 29.9 24.2 - 68.3 %    Platelets 191 150 - 400 K/uL   nRBC 0.0 0.0 - 0.2 %   Neutrophils Relative % 67 %   Neutro Abs 5.8 1.7 - 7.7 K/uL   Lymphocytes Relative 24 %   Lymphs Abs 2.1 0.7 - 4.0 K/uL   Monocytes Relative 6 %   Monocytes Absolute 0.5 0.1 - 1.0 K/uL   Eosinophils Relative 1 %   Eosinophils Absolute 0.1 0.0 - 0.5 K/uL   Basophils Relative 1 %   Basophils Absolute 0.1 0.0 - 0.1 K/uL   Immature Granulocytes 1 %   Abs Immature Granulocytes 0.04 0.00 - 0.07 K/uL  Basic metabolic panel     Status: Abnormal   Collection Time: 12/11/21  4:35 AM  Result Value Ref Range   Sodium 140 135 - 145 mmol/L   Potassium 3.7 3.5 - 5.1 mmol/L   Chloride 108 98 - 111 mmol/L   CO2 22 22 - 32 mmol/L   Glucose, Bld 115 (H) 70 - 99 mg/dL   BUN 12 6 - 20 mg/dL   Creatinine, Ser 4.19 0.44 - 1.00 mg/dL   Calcium 8.4 (L) 8.9 - 10.3 mg/dL   GFR, Estimated >62 >22 mL/min   Anion gap 10 5 - 15  Urinalysis, Routine w reflex microscopic Urine, Clean Catch     Status: None   Collection Time: 12/11/21  4:38 AM  Result Value Ref Range   Color, Urine YELLOW YELLOW   APPearance CLEAR CLEAR   Specific Gravity, Urine 1.020 1.005 - 1.030   pH 7.0 5.0 - 8.0   Glucose, UA NEGATIVE NEGATIVE mg/dL   Hgb urine dipstick NEGATIVE NEGATIVE   Bilirubin Urine NEGATIVE NEGATIVE   Ketones, ur NEGATIVE NEGATIVE mg/dL   Protein, ur NEGATIVE NEGATIVE mg/dL   Nitrite NEGATIVE NEGATIVE   Leukocytes,Ua NEGATIVE NEGATIVE   Imaging Studies: US PELVIC COMPLETE W TRANSVAGINAL AND TORSION R/O  Result Date: 12/11/2021 CLINICAL DATA:  Bilateral pelvic pain EXAM: TRANSABDOMINAL AND TRANSVAGINAL ULTRASOUND OF PELVIS DOPPLER ULTRASOUND OF OVARIES TECHNIQUE: Both transabdominal and transvaginal ultrasound examinations of the pelvis were performed. Transabdominal technique was performed for global imaging of the pelvis including uterus, ovaries, adnexal regions, and pelvic cul-de-sac. It was necessary to proceed with endovaginal exam following the  transabdominal exam to visualize the ovaries and endometrium. Color and duplex Doppler ultrasound was utilized to evaluate blood flow to the ovaries. COMPARISON:  None Available. FINDINGS: Uterus Measurements: 9 x 5 x 6 cm = volume: 160 mL. No fibroids or other mass visualized. Endometrium  Thickness: 9 mm.  No focal abnormality visualized. Right ovary Measurements: 47 x 36 x 49 mm = volume: 43 mL. 4.3 cm avascular mass with internal mid level echoes and lace-like appearance Left ovary Measurements: 46 x 36 x 34 mm = volume: 29 mL. 3 cm avascular mass with internal mid level echoes. Pulsed Doppler evaluation of both ovaries demonstrates normal low-resistance arterial and venous waveforms. IMPRESSION: 1. Complex cyst in the bilateral ovaries, 4.3 cm on the right and 3 cm on the left. Hemorrhagic cysts are favored, recommend gynecology and sonographic follow-up. 2. Normal ovarian blood flow. Electronically Signed   By: Tiburcio Pea M.D.   On: 12/11/2021 06:41    ED COURSE and MDM  Nursing notes, initial and subsequent vitals signs, including pulse oximetry, reviewed and interpreted by myself.  Vitals:   12/11/21 0515 12/11/21 0545 12/11/21 0600 12/11/21 0630  BP: 121/84 130/84  123/85  Pulse: 87 80  83  Resp: 18 18 18 18   Temp:      TempSrc:      SpO2: 97% 100% 98% 96%  Weight:      Height:       Medications  ondansetron (ZOFRAN) injection 4 mg (4 mg Intravenous Given 12/11/21 0424)  fentaNYL (SUBLIMAZE) injection 100 mcg (100 mcg Intravenous Given 12/11/21 0425)  fentaNYL (SUBLIMAZE) injection 100 mcg (100 mcg Intravenous Given 12/11/21 0626)   4:16 AM Patient's hand cramping is resolved.  I suspect this was due to hyperventilation prior to arrival.   6:51 AM Ultrasound shows bilateral ovarian cysts, right greater than left.  She has an OB/GYN with whom she can follow-up.  If the cysts are hemorrhagic they may be leaking blood causing a mittelschmerz like pain.  We will treat her pain in the  meantime pending follow-up.   PROCEDURES  Procedures   ED DIAGNOSES     ICD-10-CM   1. Cysts of both ovaries  N83.201    N83.202     2. Acute hyperventilation  R06.4          02/10/22, MD 12/11/21 [phone]

## 2022-02-22 IMAGING — US US THYROID
1 series · 14 of 25 positions shown · non-contrast
Comparison: 09/24/2018

CLINICAL DATA: Status post left thyroidectomy

Nodule follow-up
EXAM:
THYROID ULTRASOUND
TECHNIQUE: Ultrasound examination of the thyroid gland and adjacent soft
tissues was performed.

[Series 1: us thyroid · 0.08mm/px · 14 of 35 slices shown]
[im 1/35]
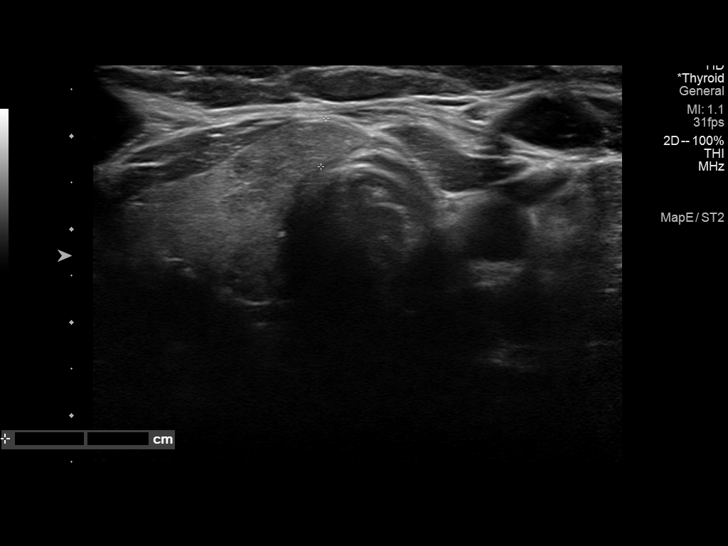
[im 3/35]
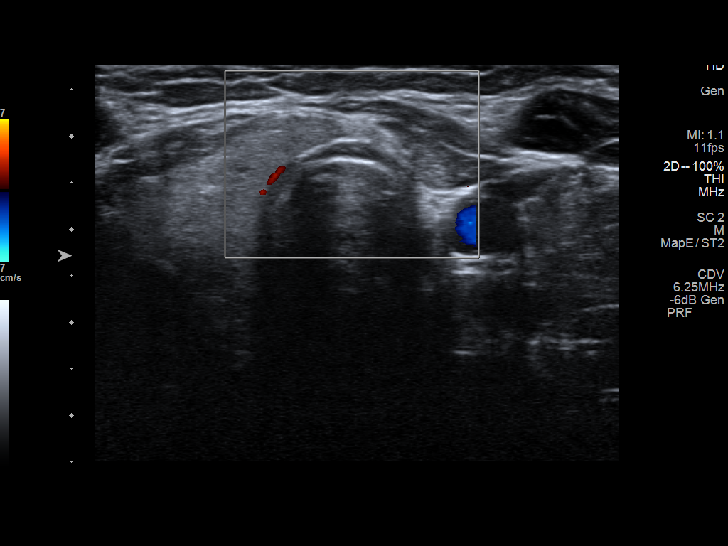
[im 6/35]
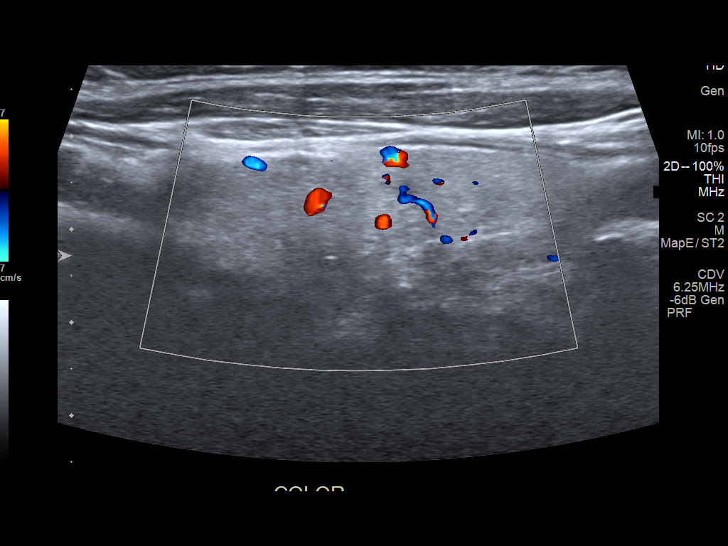
[im 9/35]
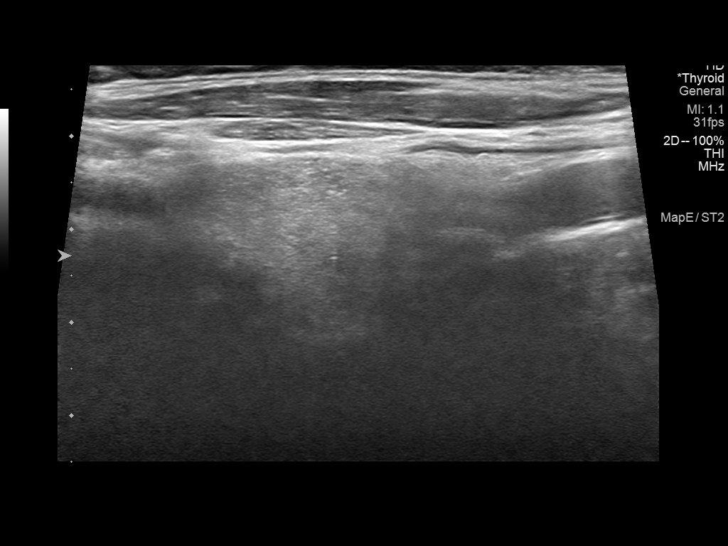
[im 12/35]
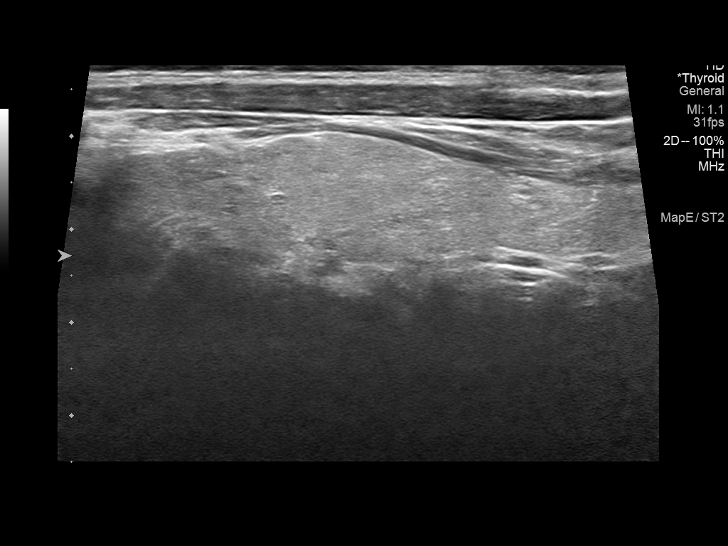
[im 13/35]
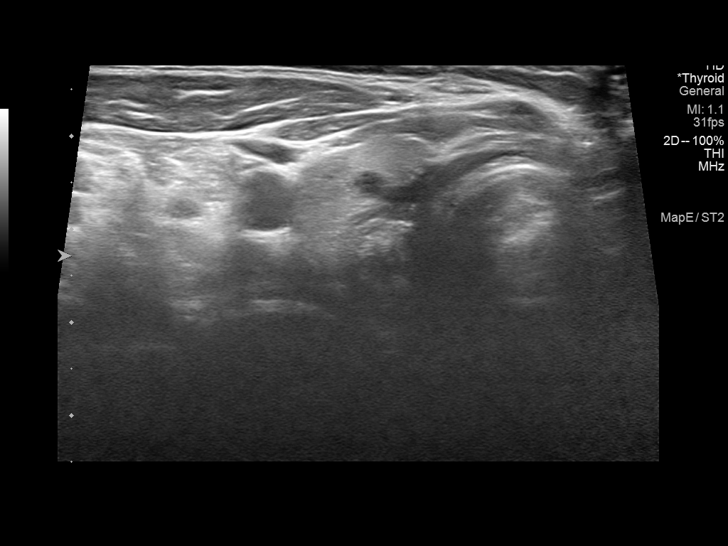
[im 16/35]
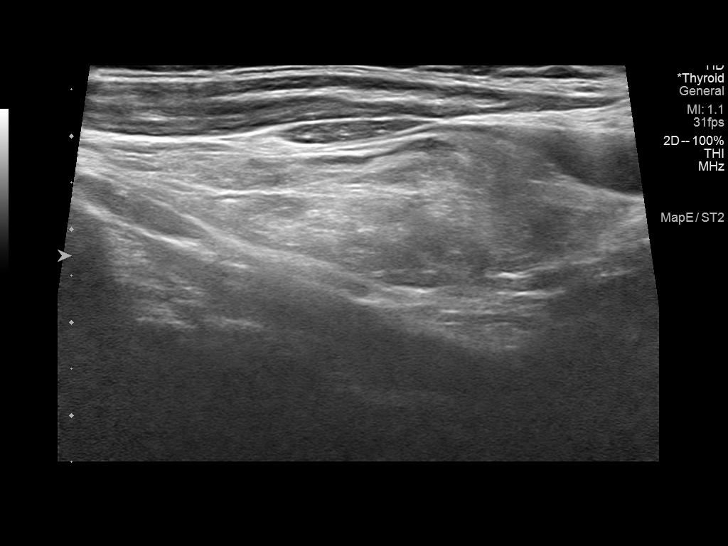
[im 19/35]
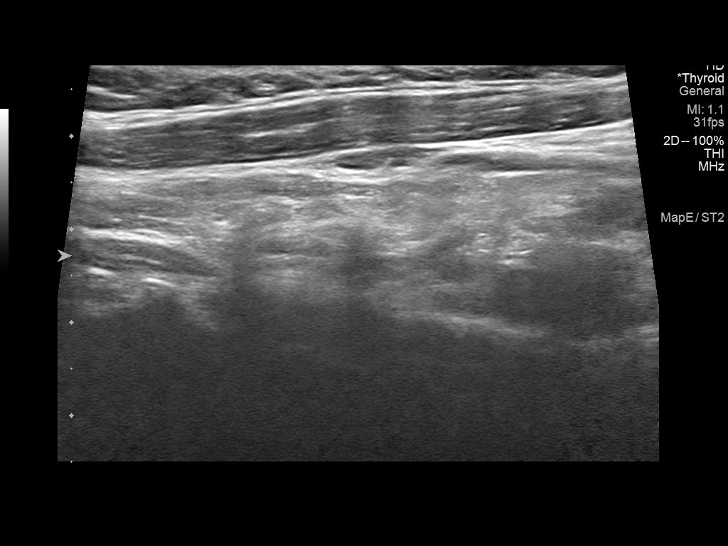
[im 22/35]
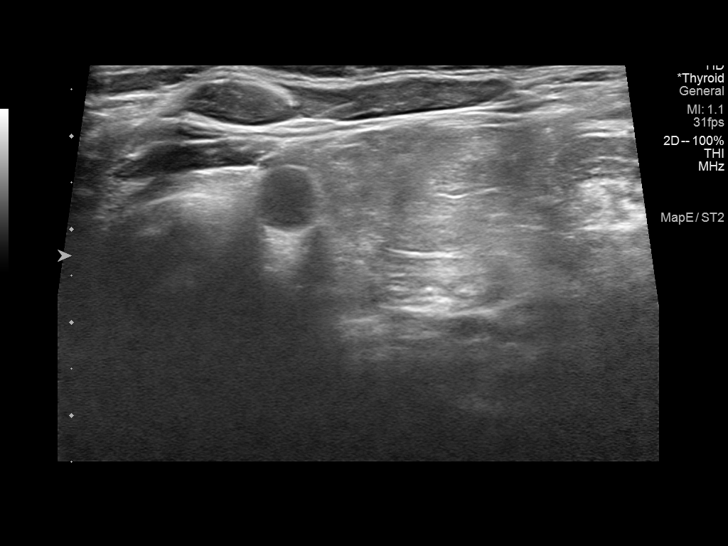
[im 23/35]
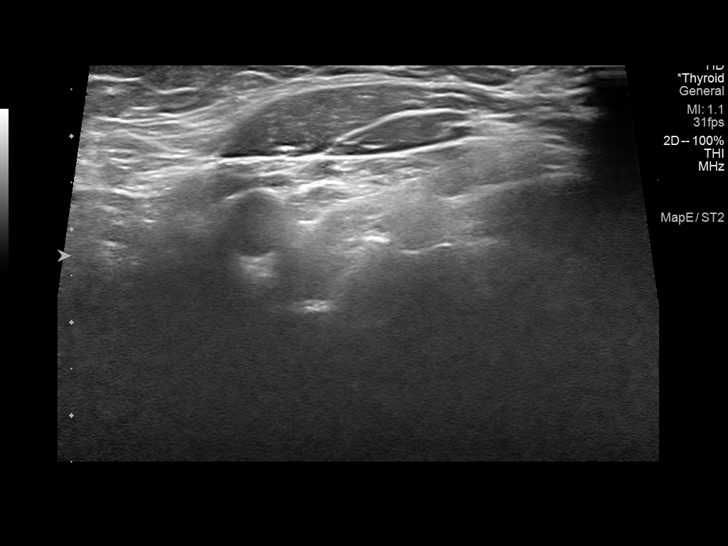
[im 26/35]
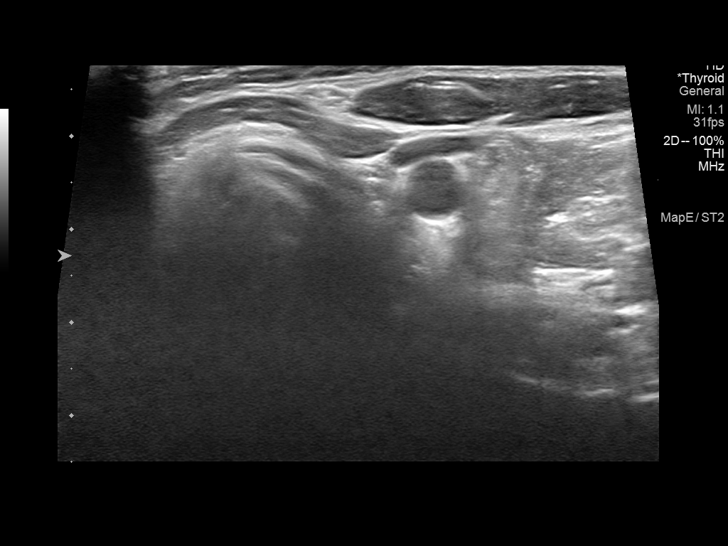
[im 29/35]
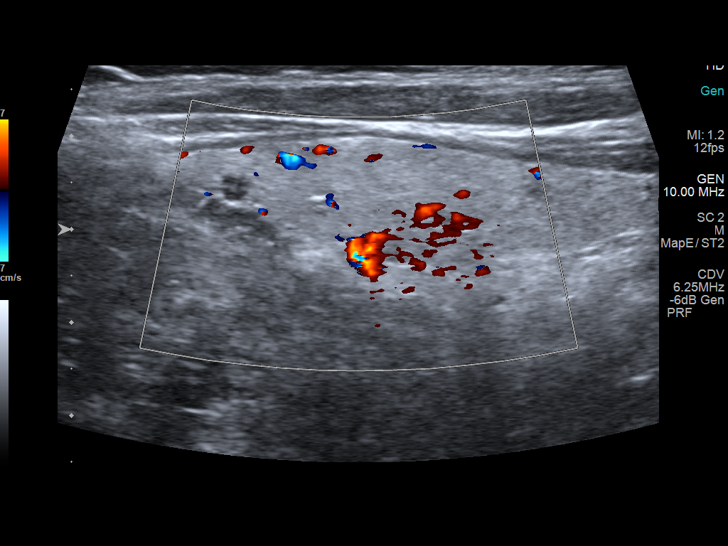
[im 32/35]
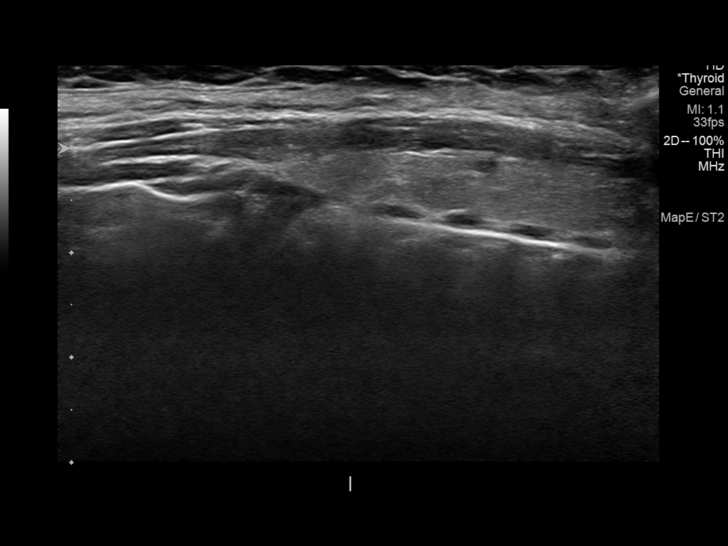
[im 35/35]
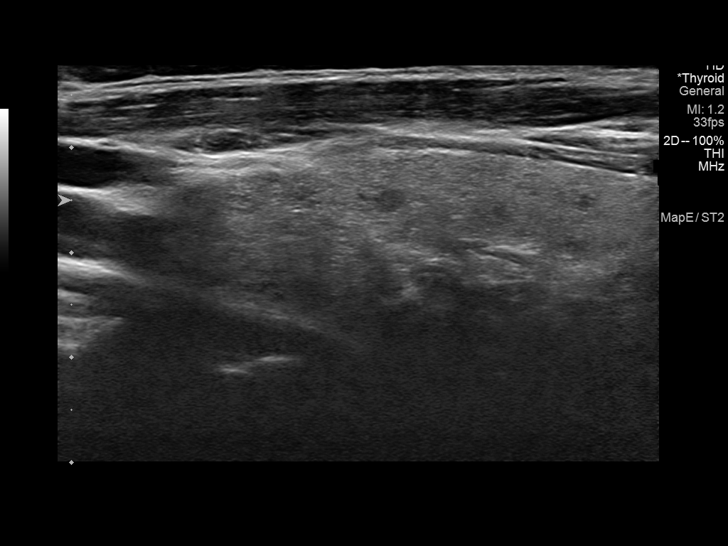

[14 of 25 positions shown; findings below may reference images not displayed]

FINDINGS: Parenchymal Echotexture: Mildly heterogeneous

Isthmus: 0.5 cm

Right lobe: 5.6 x 1.6 x 1.7 cm

Left lobe: Surgically absent

_________________________________________________________

Estimated total number of nodules >/= 1 cm: 0

Number of spongiform nodules >/=  2 cm not described below (TR1): 0

Number of mixed cystic and solid nodules >/= 1.5 cm not described
below (TR2): 0

_________________________________________________________

Subcentimeter right thyroid nodules do not meet criteria for FNA or
imaging surveillance. Postsurgical changes of left thyroidectomy are
seen. No residual thyroid tissue identified in the left
thyroidectomy bed. No enlarged lymph nodes seen adjacent to the
thyroid.
IMPRESSION: 1. Postsurgical changes of left thyroidectomy without evidence of
recurrent malignancy.
2. No suspicious right thyroid nodules.

The above is in keeping with the ACR TI-RADS recommendations - [HOSPITAL] 7119;[DATE].
